# Patient Record
Sex: Female | Born: 1985
Health system: Southern US, Community
[De-identification: ages and names within clinical notes are randomized; demographics above are authoritative.]

## PROBLEM LIST (undated history)

## (undated) DIAGNOSIS — T7840XA Allergy, unspecified, initial encounter: Secondary | ICD-10-CM

## (undated) DIAGNOSIS — N809 Endometriosis, unspecified: Secondary | ICD-10-CM

## (undated) DIAGNOSIS — N189 Chronic kidney disease, unspecified: Secondary | ICD-10-CM

## (undated) DIAGNOSIS — I10 Essential (primary) hypertension: Secondary | ICD-10-CM

## (undated) DIAGNOSIS — F419 Anxiety disorder, unspecified: Secondary | ICD-10-CM

## (undated) HISTORY — PX: BREAST EXCISIONAL BIOPSY: SUR124

## (undated) HISTORY — DX: Anxiety disorder, unspecified: F41.9

## (undated) HISTORY — DX: Essential (primary) hypertension: I10

## (undated) HISTORY — PX: BREAST SURGERY: SHX581

## (undated) HISTORY — DX: Allergy, unspecified, initial encounter: T78.40XA

## (undated) HISTORY — PX: TUBAL LIGATION: SHX77

## (undated) HISTORY — PX: OTHER SURGICAL HISTORY: SHX169

## (undated) HISTORY — DX: Chronic kidney disease, unspecified: N18.9

---

## 2017-10-23 ENCOUNTER — Encounter (HOSPITAL_COMMUNITY): Payer: Self-pay | Admitting: Emergency Medicine

## 2017-10-23 DIAGNOSIS — W01190A Fall on same level from slipping, tripping and stumbling with subsequent striking against furniture, initial encounter: Secondary | ICD-10-CM | POA: Insufficient documentation

## 2017-10-23 DIAGNOSIS — Y9389 Activity, other specified: Secondary | ICD-10-CM | POA: Insufficient documentation

## 2017-10-23 DIAGNOSIS — Y929 Unspecified place or not applicable: Secondary | ICD-10-CM | POA: Insufficient documentation

## 2017-10-23 DIAGNOSIS — Z5329 Procedure and treatment not carried out because of patient's decision for other reasons: Secondary | ICD-10-CM | POA: Insufficient documentation

## 2017-10-23 DIAGNOSIS — Y999 Unspecified external cause status: Secondary | ICD-10-CM | POA: Insufficient documentation

## 2017-10-23 DIAGNOSIS — S300XXA Contusion of lower back and pelvis, initial encounter: Secondary | ICD-10-CM | POA: Insufficient documentation

## 2017-10-23 NOTE — ED Triage Notes (Signed)
Pt states she slipped on one of her daughter's toys yesterday and landed her butt bone on the entertainment center.  Pt ambulatory but states pain increases when sitting. Has been taking extra strength tylenol since yesterday without relief.

## 2017-10-24 ENCOUNTER — Emergency Department (HOSPITAL_COMMUNITY): Payer: Self-pay

## 2017-10-24 ENCOUNTER — Emergency Department (HOSPITAL_COMMUNITY)
Admission: EM | Admit: 2017-10-24 | Discharge: 2017-10-24 | Discharge status: Against Medical Advice | Payer: Self-pay | Attending: Emergency Medicine | Admitting: Emergency Medicine

## 2017-10-24 DIAGNOSIS — S300XXA Contusion of lower back and pelvis, initial encounter: Secondary | ICD-10-CM

## 2017-10-24 DIAGNOSIS — W19XXXA Unspecified fall, initial encounter: Secondary | ICD-10-CM

## 2017-10-24 HISTORY — DX: Endometriosis, unspecified: N80.9

## 2017-10-24 LAB — POC URINE PREG, ED: Preg Test, Ur: NEGATIVE

## 2017-10-24 MED ORDER — HYDROCODONE-ACETAMINOPHEN 5-325 MG PO TABS
2.0000 | ORAL_TABLET | Freq: Once | ORAL | Status: AC
  Administered 2017-10-24: 2 via ORAL
  Filled 2017-10-24: qty 2

## 2017-10-24 MED ORDER — IBUPROFEN 800 MG PO TABS
800.0000 mg | ORAL_TABLET | Freq: Once | ORAL | Status: AC
  Administered 2017-10-24: 800 mg via ORAL
  Filled 2017-10-24: qty 1

## 2017-10-24 NOTE — ED Notes (Signed)
ED Provider at bedside. 

## 2017-10-24 NOTE — ED Notes (Signed)
Pt said she had to leave and walked out

## 2017-10-24 NOTE — ED Notes (Signed)
Pt has to leave due to childcare issues

## 2017-10-24 NOTE — ED Provider Notes (Signed)
TIME SEEN: 1:41 AM  CHIEF COMPLAINT: Back pain, tailbone pain  HPI: Patient is a 31 year old female with history of endometriosis who presents to the emergency department with complaints of lower back pain and tailbone pain after she slipped on 1 of her child's toys yesterday and hit her back on a cabinet.  Denies head injury or loss of consciousness.  No numbness, tingling or focal weakness.  Has tried lidocaine patch, heating pad, ice, extra strength Tylenol without any relief.  No bowel or bladder incontinence.  No other injury.  ROS: See HPI Constitutional: no fever  Eyes: no drainage  ENT: no runny nose   Cardiovascular:  no chest pain  Resp: no SOB  GI: no vomiting GU: no dysuria Integumentary: no rash  Allergy: no hives  Musculoskeletal: no leg swelling  Neurological: no slurred speech ROS otherwise negative  PAST MEDICAL HISTORY/PAST SURGICAL HISTORY:  Past Medical History:  Diagnosis Date  . Endometriosis     MEDICATIONS:  Prior to Admission medications   Not on File    ALLERGIES:  No Known Allergies  SOCIAL HISTORY:  Social History  Substance Use Topics  . Smoking status: Never Smoker  . Smokeless tobacco: Never Used  . Alcohol use Yes     Comment: socially    FAMILY HISTORY: No family history on file.  EXAM: BP (!) 146/99 (BP Location: Right Arm)   Pulse 81   Temp 98.1 F (36.7 C) (Oral)   Resp 16   Ht 5\' 2"  (1.575 m)   Wt 49.9 kg (110 lb)   LMP 09/23/2017 (Approximate)   SpO2 100%   BMI 20.12 kg/m  CONSTITUTIONAL: Alert and oriented and responds appropriately to questions. Well-appearing; well-nourished; GCS 15 HEAD: Normocephalic; atraumatic EYES: Conjunctivae clear, PERRL, EOMI ENT: normal nose; no rhinorrhea; moist mucous membranes; pharynx without lesions noted; no dental injury; no septal hematoma NECK: Supple, no meningismus, no LAD; no midline spinal tenderness, step-off or deformity; trachea midline CARD: RRR; S1 and S2 appreciated;  no murmurs, no clicks, no rubs, no gallops RESP: Normal chest excursion without splinting or tachypnea; breath sounds clear and equal bilaterally; no wheezes, no rhonchi, no rales; no hypoxia or respiratory distress CHEST:  chest wall stable, no crepitus or ecchymosis or deformity, nontender to palpation; no flail chest ABD/GI: Normal bowel sounds; non-distended; soft, non-tender, no rebound, no guarding; no ecchymosis or other lesions noted PELVIS:  stable, nontender to palpation BACK:  The back appears normal and is tender over the lower lumbar spine without step-off or deformity, no ecchymosis or other lesions appreciated, tender over the sacrum as well EXT: Normal ROM in all joints; non-tender to palpation; no edema; normal capillary refill; no cyanosis, no bony tenderness or bony deformity of patient's extremities, no joint effusion, compartments are soft, extremities are warm and well-perfused, no ecchymosis SKIN: Normal color for age and race; warm NEURO: Moves all extremities equally, normal sensation diffusely, normal gait but does walk slowly secondary to pain PSYCH: The patient's mood and manner are appropriate. Grooming and personal hygiene are appropriate.  MEDICAL DECISION MAKING: Patient here with complaints of lower back pain and tailbone pain after an injury that occurred yesterday.  Neurologically intact.  Will obtain x-rays and pregnancy test.  She is status post BTL.  She states her friend drove her to the emergency department.  Her friend will drive her home.  We will give Vicodin, ibuprofen and reassess.  ED PROGRESS: Patient had a negative pregnancy test.  X-rays were completed  and pending.  Patient told nursing staff that she had to leave and walked out of the emergency department without getting the results of her imaging.  I was not informed that patient had left until she was already gone.    X-ray reads came back negative.    I reviewed all nursing notes, vitals,  pertinent previous records, EKGs, lab and urine results, imaging (as available).     Ward, Layla MawKristen N, DO 10/24/17 [phone]

## 2017-10-24 NOTE — ED Notes (Signed)
Pt in xray

## 2017-10-24 NOTE — ED Notes (Signed)
Bed: WA12 Expected date:  Expected time:  Means of arrival:  Comments: 

## 2018-03-19 ENCOUNTER — Emergency Department (HOSPITAL_COMMUNITY)
Admission: EM | Admit: 2018-03-19 | Discharge: 2018-03-20 | Disposition: A | Discharge status: Home / Self Care | Payer: Self-pay | Attending: Physician Assistant | Admitting: Physician Assistant

## 2018-03-19 ENCOUNTER — Encounter (HOSPITAL_COMMUNITY): Payer: Self-pay

## 2018-03-19 DIAGNOSIS — N10 Acute tubulo-interstitial nephritis: Secondary | ICD-10-CM | POA: Insufficient documentation

## 2018-03-19 LAB — BASIC METABOLIC PANEL
Anion gap: 11 (ref 5–15)
BUN: 8 mg/dL (ref 6–20)
CALCIUM: 9 mg/dL (ref 8.9–10.3)
CO2: 22 mmol/L (ref 22–32)
CREATININE: 0.91 mg/dL (ref 0.44–1.00)
Chloride: 97 mmol/L — ABNORMAL LOW (ref 101–111)
Glucose, Bld: 100 mg/dL — ABNORMAL HIGH (ref 65–99)
Potassium: 3.8 mmol/L (ref 3.5–5.1)
SODIUM: 130 mmol/L — AB (ref 135–145)

## 2018-03-19 LAB — CBC
HCT: 37.8 % (ref 36.0–46.0)
Hemoglobin: 12.6 g/dL (ref 12.0–15.0)
MCH: 27.5 pg (ref 26.0–34.0)
MCHC: 33.3 g/dL (ref 30.0–36.0)
MCV: 82.5 fL (ref 78.0–100.0)
PLATELETS: 242 10*3/uL (ref 150–400)
RBC: 4.58 MIL/uL (ref 3.87–5.11)
RDW: 12 % (ref 11.5–15.5)
WBC: 11.3 10*3/uL — AB (ref 4.0–10.5)

## 2018-03-19 LAB — I-STAT BETA HCG BLOOD, ED (MC, WL, AP ONLY): I-stat hCG, quantitative: <5 m[IU]/mL (ref <5)

## 2018-03-19 LAB — URINALYSIS, ROUTINE W REFLEX MICROSCOPIC
Bilirubin Urine: NEGATIVE
Glucose, UA: NEGATIVE mg/dL
KETONES UR: 20 mg/dL — AB
NITRITE: POSITIVE — AB
PROTEIN: 30 mg/dL — AB
Specific Gravity, Urine: 1.016 (ref 1.005–1.030)
pH: 5 (ref 5.0–8.0)

## 2018-03-19 MED ORDER — ACETAMINOPHEN 325 MG PO TABS
650.0000 mg | ORAL_TABLET | Freq: Once | ORAL | Status: AC
  Administered 2018-03-19: 650 mg via ORAL
  Filled 2018-03-19: qty 2

## 2018-03-19 MED ORDER — ONDANSETRON HCL 4 MG/2ML IJ SOLN
4.0000 mg | Freq: Once | INTRAMUSCULAR | Status: AC
  Administered 2018-03-19: 4 mg via INTRAVENOUS
  Filled 2018-03-19: qty 2

## 2018-03-19 MED ORDER — SODIUM CHLORIDE 0.9 % IV BOLUS (SEPSIS)
1000.0000 mL | Freq: Once | INTRAVENOUS | Status: AC
  Administered 2018-03-19: 1000 mL via INTRAVENOUS

## 2018-03-19 MED ORDER — MORPHINE SULFATE (PF) 4 MG/ML IV SOLN
4.0000 mg | Freq: Once | INTRAVENOUS | Status: AC
  Administered 2018-03-19: 4 mg via INTRAVENOUS
  Filled 2018-03-19: qty 1

## 2018-03-19 MED ORDER — SODIUM CHLORIDE 0.9 % IV SOLN
1.0000 g | Freq: Once | INTRAVENOUS | Status: AC
  Administered 2018-03-19: 1 g via INTRAVENOUS
  Filled 2018-03-19: qty 10

## 2018-03-19 NOTE — ED Provider Notes (Signed)
[facility]   CSN: [id number] Arrival date & time: 03/19/18  2109     History   Chief Complaint Chief Complaint  Patient presents with  . Flank Pain  . Fever  . Urinary Retention    HPI Hans EdenMelissa Holtsclaw is a 32 y.o. female.  HPI Hans EdenMelissa Shutter is a 32 y.o. female presents to emergency department complaining of left flank pain and fever.  Patient states her symptoms began 2 days ago.  She reports associated nausea.  Denies any dysuria, hematuria, frequency or urgency.  Denies any pelvic pain.  No vaginal discharge or bleeding.  She does report nausea, however no vomiting.  No diarrhea.  States pain is worsened with any movement and taking deep breaths.  She denies any chest pain or coughing.  Fever at home.  She has been taken Tylenol and Motrin around-the-clock for her fever, last dose 6 hours ago.  She states she has had multiple urinary tract infections and sometimes has no symptoms when she gets them.  She denies any history of kidney stones.  Denies any history of pyelonephritis.  Past Medical History:  Diagnosis Date  . Endometriosis     There are no active problems to display for this patient.   Past Surgical History:  Procedure Laterality Date  . BREAST SURGERY     tumor (benign) left breast removal   . CESAREAN SECTION       OB History   None      Home Medications    Prior to Admission medications   Not on File    Family History History reviewed. No pertinent family history.  Social History Social History   Tobacco Use  . Smoking status: Never Smoker  . Smokeless tobacco: Never Used  Substance Use Topics  . Alcohol use: Yes    Comment: socially  . Drug use: No     Allergies   Patient has no known allergies.   Review of Systems Review of Systems  Constitutional: Positive for chills and fever.  HENT: Negative for congestion and sore throat.   Respiratory: Negative for cough, chest  tightness and shortness of breath.   Cardiovascular: Negative for chest pain, palpitations and leg swelling.  Gastrointestinal: Positive for abdominal pain and nausea. Negative for diarrhea and vomiting.  Genitourinary: Positive for flank pain. Negative for dysuria, pelvic pain, vaginal bleeding, vaginal discharge and vaginal pain.  Musculoskeletal: Negative for arthralgias, myalgias, neck pain and neck stiffness.  Skin: Negative for rash.  Neurological: Negative for dizziness, weakness and headaches.  All other systems reviewed and are negative.    Physical Exam Updated Vital Signs BP 128/83 (BP Location: Right Arm)   Pulse (!) 106   Temp (!) 102.5 F (39.2 C) (Oral)   Resp 14   Ht 5\' 2"  (1.575 m)   Wt 49.9 kg (110 lb)   LMP 02/26/2018   SpO2 100%   BMI 20.12 kg/m   Physical Exam  Constitutional: She appears well-developed and well-nourished. No distress.  HENT:  Head: Normocephalic.  Eyes: Conjunctivae are normal.  Neck: Neck supple.  Cardiovascular: Normal rate, regular rhythm and normal heart sounds.  Pulmonary/Chest: Effort normal and breath sounds normal. No respiratory distress. She has no wheezes. She has no rales.  Abdominal: Soft. Bowel sounds are normal. She exhibits no distension. There is no tenderness. There is no rebound.  Left CVA tenderness.  Abdomen nontender.  Musculoskeletal: She exhibits no edema.  Neurological: She is alert.  Skin: Skin is warm and dry.  Psychiatric: She has a normal mood and affect. Her behavior is normal.  Nursing note and vitals reviewed.    ED Treatments / Results  Labs (all labs ordered are listed, but only abnormal results are displayed) Labs Reviewed  BASIC METABOLIC PANEL - Abnormal; Notable for the following components:      Result Value   Sodium 130 (*)    Chloride 97 (*)    Glucose, Bld 100 (*)    All other components within normal limits  CBC - Abnormal; Notable for the following components:   WBC 11.3 (*)     All other components within normal limits  URINE CULTURE  URINALYSIS, ROUTINE W REFLEX MICROSCOPIC  I-STAT BETA HCG BLOOD, ED ([facility], [facility], [facility] ONLY)    EKG None  Radiology No results found.  Procedures Procedures (including critical care time)  Medications Ordered in ED Medications  acetaminophen (TYLENOL) tablet 650 mg (has no administration in time range)  sodium chloride 0.9 % bolus 1,000 mL (has no administration in time range)  ondansetron (ZOFRAN) injection 4 mg (has no administration in time range)  morphine 4 MG/ML injection 4 mg (has no administration in time range)     Initial Impression / Assessment and Plan / ED Course  I have reviewed the triage vital signs and the nursing notes.  Pertinent labs & imaging results that were available during my care of the patient were reviewed by me and considered in my medical decision making (see chart for details).     Patient in emergency department with left flank pain, patient felt very warm during my exam, rechecked her temperature, it is 102.5.  Order Tylenol.  Otherwise vital signs unremarkable other than mild tachycardia.  She has no abdominal tenderness on exam, no pelvic pain, no vaginal discharge or bleeding.  She has history of urinary tract infections.  She has significant left CVA tenderness on exam.  I suspect she may have pyelonephritis.  Blood work obtained upfront showed elevated Kon blood cell count of 11.3.  Negative pregnancy test.  Sodium of 130.  Will give IV fluids, pain medications, antiemetics, urine analysis pending.  1:12 AM Urine showing too numerous to count Nary blood cells, positive nitrite and large leukocyte.  Consistent with infection.  In setting of left CVA tenderness, most likely pyelonephritis.  Patient received 2 L of saline, received Rocephin for the infection.  She states she feels much better.  Although considered ischemic kidney stone, her history and exam is not consistent with stone disease.   Onset of symptoms was gradual, worse with any movement, no hematuria.  No history of stones.  Her blood pressure has remained normal, do not think she is septic.  She would like to be discharged home.  Will discharge home with antibiotics, tramadol for pain, continue Tylenol Motrin for fever, drink plenty of fluids.  Instructed to follow-up closely with family doctor.  Return precautions discussed.   Vitals:   03/20/18 0000 03/20/18 0015 03/20/18 0030 03/20/18 0100  BP: 116/79  113/73 107/74  Pulse: (!) 106 (!) 108 (!) 103 (!) 105  Resp:   16 16  Temp:      TempSrc:      SpO2: 100% 100% 100% 100%  Weight:      Height:        Final Clinical Impressions(s) / ED Diagnoses   Final diagnoses:  Acute pyelonephritis    ED Discharge Orders    None  Jaynie Crumble, PA-C 03/20/18 0306    Abelino Derrick, MD 03/20/18 [phone]

## 2018-03-19 NOTE — ED Notes (Signed)
Pt presents with left sided flank pain and difficulty urinating.

## 2018-03-19 NOTE — ED Triage Notes (Signed)
Onset yesterday left flank pain, urinary frequency and urgency.  No dysuria or blood noted in urine.  Pt reports 102.2 temperature today. Pt has been taking Tylenol and Motrin, last dose Tylenol @ 2pm.

## 2018-03-20 MED ORDER — CEPHALEXIN 500 MG PO CAPS: 500.0000 mg | ORAL_CAPSULE | Freq: Three times a day (TID) | ORAL | 0 refills | Status: DC

## 2018-03-20 MED ORDER — TRAMADOL HCL 50 MG PO TABS: 50.0000 mg | ORAL_TABLET | Freq: Four times a day (QID) | ORAL | 0 refills | Status: DC | PRN

## 2018-03-20 NOTE — Discharge Instructions (Addendum)
Take Tylenol and/or Motrin for fever and pain.  Tramadol for severe pain.  Take Keflex as prescribed until all gone for your kidney infection.  If you are not improving in 2 days, return to emergency department.  Return if unable to keep your medications down due to vomiting.  Return if any new concerning symptoms.

## 2018-03-22 LAB — URINE CULTURE

## 2018-03-23 ENCOUNTER — Telehealth: Payer: Self-pay | Admitting: *Deleted

## 2018-03-23 NOTE — Telephone Encounter (Signed)
Post ED Visit - Positive Culture Follow-up  Culture report reviewed by antimicrobial stewardship pharmacist:  [] Nathan Batchelder, Pharm.D. [] Jeremy Frens, Pharm.D., BCPS AQ-ID [] Mike Maccia, Pharm.D., BCPS [x] Elizabeth Martin, Pharm.D., BCPS [] Minh Pham, Pharm.D., BCPS, AAHIVP [] Michelle Turner, Pharm.D., BCPS, AAHIVP [] Rachel Rumbarger, PharmD, BCPS [] Shannon Parkey, PharmD [] Andy Johnston, PharmD, BCPS  Positive urine culture Treated with Cephalexin, organism sensitive to the same and no further patient follow-up is required at this time.  Rachael Sherman 03/23/2018, 11:27 AM   

## 2018-06-29 ENCOUNTER — Ambulatory Visit (HOSPITAL_COMMUNITY)
Admission: EM | Admit: 2018-06-29 | Discharge: 2018-06-29 | Disposition: A | Discharge status: Home / Self Care | Payer: Self-pay | Attending: Family Medicine | Admitting: Family Medicine

## 2018-06-29 ENCOUNTER — Encounter (HOSPITAL_COMMUNITY): Payer: Self-pay | Admitting: Physician Assistant

## 2018-06-29 DIAGNOSIS — R21 Rash and other nonspecific skin eruption: Secondary | ICD-10-CM

## 2018-06-29 MED ORDER — PREDNISONE 20 MG PO TABS: 40.0000 mg | ORAL_TABLET | Freq: Every day | ORAL | 0 refills | Status: AC

## 2018-06-29 NOTE — ED Triage Notes (Signed)
Assessment per PA 

## 2018-06-29 NOTE — Discharge Instructions (Signed)
Rash more consistent with insect bite vs contact dermatitis. You can continue hydrocortisone cream and take over the counter allergy medicine such as zyrtec, allegra. If symptoms not improving, you can start prednisone as directed. Monitor for spreading redness, increased warmth, increased pain, fever, follow up for reevaluation needed.

## 2018-06-29 NOTE — ED Provider Notes (Signed)
[facility]    CSN: [id number] Arrival date & time: 06/29/18  1654     History   Chief Complaint Chief Complaint  Patient presents with  . Rash    HPI Hans EdenMelissa Zani is a 32 y.o. female.   32 year old female comes in for 2-day history of rash to the right upper arm and back.  Unsure if insect bite.  Area is itching.  Denies pain.  Denies spreading erythema, increased warmth, fever.  Denies any new hygiene product contact.  Applied hydrocortisone cream with good relief temporarily.  She has still been scratching throughout the day.     Past Medical History:  Diagnosis Date  . Endometriosis     There are no active problems to display for this patient.   Past Surgical History:  Procedure Laterality Date  . BREAST SURGERY     tumor (benign) left breast removal   . CESAREAN SECTION      OB History   None      Home Medications    Prior to Admission medications   Medication Sig Start Date End Date Taking? Authorizing Provider  predniSONE (DELTASONE) 20 MG tablet Take 2 tablets (40 mg total) by mouth daily for 5 days. 06/29/18 07/04/18  Belinda FisherYu, Sinthia Karabin V, PA-C    Family History No family history on file.  Social History Social History   Tobacco Use  . Smoking status: Never Smoker  . Smokeless tobacco: Never Used  Substance Use Topics  . Alcohol use: Yes    Comment: socially  . Drug use: No     Allergies   Patient has no known allergies.   Review of Systems Review of Systems  Reason unable to perform ROS: See HPI as above.     Physical Exam Triage Vital Signs ED Triage Vitals  Enc Vitals Group     BP      Pulse      Resp      Temp      Temp src      SpO2      Weight      Height      Head Circumference      Peak Flow      Pain Score      Pain Loc      Pain Edu?      Excl. in GC?    No data found.  Updated Vital Signs BP 125/77   Pulse 72   Temp 98.2 F (36.8 C) (Temporal)   Resp 16   SpO2 100%   Physical Exam    Constitutional: She is oriented to person, place, and time. She appears well-developed and well-nourished. No distress.  HENT:  Head: Normocephalic and atraumatic.  Eyes: Pupils are equal, round, and reactive to light. Conjunctivae are normal.  Neurological: She is alert and oriented to person, place, and time.  Skin: Skin is warm and dry. She is not diaphoretic.  Urticarial rash to the right upper arm, right scapula.  Nontender on palpation.  No erythema, increased warmth.  No fluctuance felt.    UC Treatments / Results  Labs (all labs ordered are listed, but only abnormal results are displayed) Labs Reviewed - No data to display  EKG None  Radiology No results found.  Procedures Procedures (including critical care time)  Medications Ordered in UC Medications - No data to display  Initial Impression / Assessment and Plan / UC Course  I have reviewed the triage vital signs and the  nursing notes.  Pertinent labs & imaging results that were available during my care of the patient were reviewed by me and considered in my medical decision making (see chart for details).    Patient to continue hydrocortisone cream and start antihistamine.  Refrain from scratching.  Rx of prednisone provided, can start if symptoms not improving.  Return precautions given.  Final Clinical Impressions(s) / UC Diagnoses   Final diagnoses:  Rash    ED Prescriptions    Medication Sig Dispense Auth. Provider   predniSONE (DELTASONE) 20 MG tablet Take 2 tablets (40 mg total) by mouth daily for 5 days. 10 tablet Threasa Alpha, [place] 06/29/18 1713

## 2018-10-30 ENCOUNTER — Ambulatory Visit: Payer: Self-pay | Admitting: Obstetrics and Gynecology

## 2018-11-14 ENCOUNTER — Ambulatory Visit (INDEPENDENT_AMBULATORY_CARE_PROVIDER_SITE_OTHER): Payer: Self-pay | Admitting: Family Medicine

## 2018-11-14 ENCOUNTER — Encounter: Payer: Self-pay | Admitting: Family Medicine

## 2018-11-14 VITALS — BP 104/72 | HR 62 | Temp 98.2°F | Wt 98.6 lb

## 2018-11-14 DIAGNOSIS — R350 Frequency of micturition: Secondary | ICD-10-CM

## 2018-11-14 DIAGNOSIS — Z87440 Personal history of urinary (tract) infections: Secondary | ICD-10-CM

## 2018-11-14 LAB — POC URINALSYSI DIPSTICK (AUTOMATED)
Bilirubin, UA: NEGATIVE
Blood, UA: NEGATIVE
Glucose, UA: NEGATIVE
KETONES UA: NEGATIVE
LEUKOCYTES UA: NEGATIVE
Nitrite, UA: NEGATIVE
PH UA: 8 (ref 5.0–8.0)
PROTEIN UA: NEGATIVE
Spec Grav, UA: 1.02 (ref 1.010–1.025)
Urobilinogen, UA: 0.2 E.U./dL

## 2018-11-14 NOTE — Patient Instructions (Signed)

## 2018-11-14 NOTE — Progress Notes (Signed)
Rachael EdenMelissa Proehl is a 32 y.o. female who presents today with concerns of urinary urgency and frequency. She reports a history of UTI's but has not experienced one in the last 9 months. She denies any risk for STI/STD or using any new feminine hygiene products.  Review of Systems  Constitutional: Negative for chills, fever and malaise/fatigue.  HENT: Negative for congestion, ear discharge, ear pain, sinus pain and sore throat.   Eyes: Negative.   Respiratory: Negative for cough, sputum production and shortness of breath.   Cardiovascular: Negative.  Negative for chest pain.  Gastrointestinal: Negative for abdominal pain, diarrhea, nausea and vomiting.  Genitourinary: Positive for frequency and urgency. Negative for dysuria and hematuria.  Musculoskeletal: Negative for myalgias.  Skin: Negative.   Neurological: Negative for headaches.  Endo/Heme/Allergies: Negative.   Psychiatric/Behavioral: Negative.     O: Vitals:   11/14/18 1918  BP: 104/72  Pulse: 62  Temp: 98.2 F (36.8 C)  SpO2: 100%     Physical Exam  Constitutional: She is oriented to person, place, and time. Vital signs are normal. She appears well-developed and well-nourished. She is active.  Non-toxic appearance. She does not have a sickly appearance.  HENT:  Head: Normocephalic.  Right Ear: Hearing, tympanic membrane, external ear and ear canal normal.  Left Ear: Hearing, tympanic membrane, external ear and ear canal normal.  Nose: Nose normal.  Mouth/Throat: Uvula is midline and oropharynx is clear and moist.  Neck: Normal range of motion. Neck supple.  Cardiovascular: Normal rate, regular rhythm, normal heart sounds and normal pulses.  Pulmonary/Chest: Effort normal and breath sounds normal.  Abdominal: Soft. Bowel sounds are normal. There is no tenderness. There is no CVA tenderness.  Musculoskeletal: Normal range of motion.  Lymphadenopathy:       Head (right side): No submental and no submandibular adenopathy  present.       Head (left side): No submental and no submandibular adenopathy present.    She has no cervical adenopathy.  Neurological: She is alert and oriented to person, place, and time.  Psychiatric: She has a normal mood and affect.  Vitals reviewed.   A: 1. Urinary frequency   2. History of UTI    P: Exam findings, diagnosis etiology and medication use and indications reviewed with patient. Follow- Up and discharge instructions provided. No emergent/urgent issues found on exam.  Patient verbalized understanding of information provided and agrees with plan of care (POC), all questions answered.  discussed negative results and benefit of monitoring condition and increasing hydration.  1. Urinary frequency - POCT Urinalysis Dipstick (Automated) Results for orders placed or performed in visit on 11/14/18 (from the past 24 hour(s))  POCT Urinalysis Dipstick (Automated)     Status: None   Collection Time: 11/14/18  7:34 PM  Result Value Ref Range   Color, UA yellow    Clarity, UA clear    Glucose, UA Negative Negative   Bilirubin, UA neg    Ketones, UA neg    Spec Grav, UA 1.020 1.010 - 1.025   Blood, UA neg    pH, UA 8.0 5.0 - 8.0   Protein, UA Negative Negative   Urobilinogen, UA 0.2 0.2 or 1.0 E.U./dL   Nitrite, UA neg    Leukocytes, UA Negative Negative   2. History of UTI Advised to monitor symptoms and follow up if symptoms become worse. Due to length of symptoms almost 2 week recommend monitoring since lab results and physical exam were WNL.

## 2018-11-30 MED FILL — metroNIDAZOLE 500 MG TABS: 500 | 7 days supply | Qty: 14 | Fill #0

## 2018-12-15 ENCOUNTER — Ambulatory Visit (HOSPITAL_COMMUNITY)
Admission: EM | Admit: 2018-12-15 | Discharge: 2018-12-15 | Disposition: A | Discharge status: Home / Self Care | Payer: No Typology Code available for payment source | Attending: Family Medicine | Admitting: Family Medicine

## 2018-12-15 ENCOUNTER — Ambulatory Visit (INDEPENDENT_AMBULATORY_CARE_PROVIDER_SITE_OTHER): Payer: No Typology Code available for payment source

## 2018-12-15 ENCOUNTER — Telehealth (HOSPITAL_COMMUNITY): Payer: Self-pay | Admitting: Emergency Medicine

## 2018-12-15 ENCOUNTER — Encounter (HOSPITAL_COMMUNITY): Payer: Self-pay

## 2018-12-15 DIAGNOSIS — R05 Cough: Secondary | ICD-10-CM | POA: Diagnosis not present

## 2018-12-15 DIAGNOSIS — J40 Bronchitis, not specified as acute or chronic: Secondary | ICD-10-CM | POA: Insufficient documentation

## 2018-12-15 DIAGNOSIS — R079 Chest pain, unspecified: Secondary | ICD-10-CM

## 2018-12-15 MED ORDER — AMOXICILLIN 875 MG PO TABS: 875.0000 mg | ORAL_TABLET | Freq: Two times a day (BID) | ORAL | 0 refills | Status: DC

## 2018-12-15 MED FILL — AMOXICILLIN 875 MG TABLET: 875 | 10 days supply | Qty: 20 | Fill #0

## 2018-12-15 NOTE — ED Provider Notes (Signed)
[facility]    CSN: [id number] Arrival date & time: 12/15/18  1540     History   Chief Complaint Chief Complaint  Patient presents with  . Chest Pain  . Back Pain  . Cough    HPI Rachael Sherman is a 32 y.o. female.   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  Associated mild nausea.  Initially had fever, now taking ibuprofen regularly to control symptoms.  Nonsmoker.  No h/o asthma.       Past Medical History:  Diagnosis Date  . Endometriosis     There are no active problems to display for this patient.   Past Surgical History:  Procedure Laterality Date  . BREAST SURGERY     tumor (benign) left breast removal   . CESAREAN SECTION      OB History   No obstetric history on file.      Home Medications    Prior to Admission medications   Medication Sig Start Date End Date Taking? Authorizing Provider  amoxicillin (AMOXIL) 875 MG tablet Take 1 tablet (875 mg total) by mouth 2 (two) times daily. 12/15/18   Elvina SidleLauenstein, Lusine Corlett, MD    Family History History reviewed. No pertinent family history.  Social History Social History   Tobacco Use  . Smoking status: Never Smoker  . Smokeless tobacco: Never Used  Substance Use Topics  . Alcohol use: Yes    Comment: socially  . Drug use: No     Allergies   Patient has no known allergies.   Review of Systems Review of Systems  Constitutional: Positive for fever.  HENT: Negative.   Respiratory: Positive for cough and chest tightness.   Cardiovascular: Negative.   Gastrointestinal: Positive for nausea. Negative for vomiting.     Physical Exam Triage Vital Signs ED Triage Vitals [12/15/18 1555]  Enc Vitals Group     BP (!) 121/91     Pulse Rate 66     Resp 16     Temp 98.3 F (36.8 C)     Temp Source Oral     SpO2 100 %     Weight    Height      Head Circumference      Peak Flow      Pain Score      Pain Loc      Pain Edu?      Excl. in GC?    No data found.  Updated Vital Signs BP (!) 121/91 (BP Location: Right Arm)   Pulse 66   Temp 98.3 F (36.8 C) (Oral)   Resp 16   LMP 12/02/2018 (Exact Date)   SpO2 100%    Physical Exam Vitals signs and nursing note reviewed.  Constitutional:      General: She is not in acute distress.    Appearance: She is well-developed and normal weight. She is not ill-appearing, toxic-appearing or diaphoretic.  HENT:     Head: Normocephalic.  Eyes:     Pupils: Pupils are equal, round, and reactive to light.  Neck:     Musculoskeletal: Normal range of motion and neck supple.  Cardiovascular:     Rate and Rhythm: Normal rate.     Heart sounds: Normal heart sounds.  Pulmonary:     Effort: Pulmonary effort is normal.  Breath sounds: Normal breath sounds.  Skin:    General: Skin is warm and dry.  Neurological:     General: No focal deficit present.     Mental Status: She is alert.  Psychiatric:        Mood and Affect: Mood normal.        Behavior: Behavior normal.      UC Treatments / Results  Labs (all labs ordered are listed, but only abnormal results are displayed) Labs Reviewed - No data to display  EKG None  Radiology Dg Chest 2 View  Result Date: 12/15/2018 CLINICAL DATA:  Cough and chest pain with fevers EXAM: CHEST - 2 VIEW COMPARISON:  None. FINDINGS: The heart size and mediastinal contours are within normal limits. Both lungs are clear. The visualized skeletal structures are unremarkable. Bilateral nipple shadows are noted. IMPRESSION: No active cardiopulmonary disease. Electronically Signed   By: Alcide CleverMark  Lukens M.D.   On: 12/15/2018 16:20    Procedures Procedures (including critical care time)  Medications Ordered in UC Medications - No data to display  Initial Impression / Assessment and Plan / UC Course  I have reviewed the triage vital  signs and the nursing notes.  Pertinent labs & imaging results that were available during my care of the patient were reviewed by me and considered in my medical decision making (see chart for details).    Final Clinical Impressions(s) / UC Diagnoses   Final diagnoses:  Bronchitis     Discharge Instructions     I believe the hazy density in the right middle lung is caused by a mucous plug that obstructs a bronchial tube.    ED Prescriptions    Medication Sig Dispense Auth. Provider   amoxicillin (AMOXIL) 875 MG tablet Take 1 tablet (875 mg total) by mouth 2 (two) times daily. 20 tablet Elvina SidleLauenstein, Treydon Henricks, MD     Controlled Substance Prescriptions [place] Controlled Substance Registry consulted? Not Applicable   Elvina SidleLauenstein, Giara Mcgaughey, MD 12/15/18 1630

## 2018-12-15 NOTE — Discharge Instructions (Addendum)
I believe the hazy density in the right middle lung is caused by a mucous plug that obstructs a bronchial tube.

## 2018-12-15 NOTE — ED Triage Notes (Signed)
Pt complains of chest and back pain with coughing that started 3 days ago.

## 2018-12-21 ENCOUNTER — Telehealth (HOSPITAL_COMMUNITY): Payer: Self-pay | Admitting: Family Medicine

## 2018-12-21 MED ORDER — HYDROCODONE-HOMATROPINE 5-1.5 MG/5ML PO SYRP: 5.0000 mL | ORAL_SOLUTION | Freq: Four times a day (QID) | ORAL | 0 refills | Status: DC | PRN

## 2018-12-21 NOTE — Telephone Encounter (Signed)
Cough is relentless and keeping patient awake.  Also having chest wall soreness from the cough No fever Took augmentin

## 2019-01-23 ENCOUNTER — Telehealth: Payer: No Typology Code available for payment source | Admitting: Physician Assistant

## 2019-01-23 DIAGNOSIS — N898 Other specified noninflammatory disorders of vagina: Secondary | ICD-10-CM | POA: Diagnosis not present

## 2019-01-23 MED ORDER — METRONIDAZOLE 500 MG PO TABS: 500.0000 mg | ORAL_TABLET | Freq: Two times a day (BID) | ORAL | 0 refills | Status: DC

## 2019-01-23 MED FILL — metroNIDAZOLE 500 MG TABS: 500 | 7 days supply | Qty: 14 | Fill #0

## 2019-01-23 NOTE — Progress Notes (Signed)

## 2019-03-06 ENCOUNTER — Telehealth: Payer: No Typology Code available for payment source | Admitting: Physician Assistant

## 2019-03-06 DIAGNOSIS — R300 Dysuria: Secondary | ICD-10-CM

## 2019-03-06 MED ORDER — NITROFURANTOIN MONOHYD MACRO 100 MG PO CAPS: 100.0000 mg | ORAL_CAPSULE | Freq: Two times a day (BID) | ORAL | 0 refills | Status: AC

## 2019-03-06 MED FILL — NITROFURANTOIN MONO-MCR 100: 100 | 5 days supply | Qty: 10 | Fill #0

## 2019-03-06 NOTE — Progress Notes (Signed)
We are sorry that you are not feeling well.  Here is how we plan to help!  Based on what you shared with me it looks like you most likely have a simple urinary tract infection.  A UTI (Urinary Tract Infection) is a bacterial infection of the bladder.  Most cases of urinary tract infections are simple to treat but a key part of your care is to encourage you to drink plenty of fluids and watch your symptoms carefully.  I have prescribed MacroBid 100 mg twice a day for 5 days.  Your symptoms should gradually improve. Call us if the burning in your urine worsens, you develop worsening fever, back pain or pelvic pain or if your symptoms do not resolve after completing the antibiotic.  Urinary tract infections can be prevented by drinking plenty of water to keep your body hydrated.  Also be sure when you wipe, wipe from front to back and don't hold it in!  If possible, empty your bladder every 4 hours.  Your e-visit answers were reviewed by a board certified advanced clinical practitioner to complete your personal care plan.  Depending on the condition, your plan could have included both over the counter or prescription medications.  If there is a problem please reply once you have received a response from your provider.  Your safety is important to [place].  If you have drug allergies check your prescription carefully.    You can use MyChart to ask questions about today's visit, request a non-urgent call back, or ask for a work or school excuse for 24 hours related to this e-Visit. If it has been greater than 24 hours you will need to follow up with your provider, or enter a new e-Visit to address those concerns.   You will get an e-mail in the next two days asking about your experience.  I hope that your e-visit has been valuable and will speed your recovery. Thank you for using e-visits.    ===View-only below this line===   ----- Message -----    From: Hans Eden    Sent: 03/06/2019  3:10 PM  EDT      To: E-Visit Mailing List Subject: E-Visit Submission: Urinary Problems  E-Visit Submission: Urinary Problems --------------------------------  Question: Which of the following are you experiencing? Answer:   Pain while passing urine            Change in urine appearance or smell  Question: When you have pain when passing urine, which of these apply? Answer:   I have a burning sensation  Question: Are you able to pass urine? Answer:   Yes, I can pass urine.  Question: How long have you had pain or difficulty passing urine? Answer:   More  than two days but less than one week  Question: Do you have a fever? Answer:   No, I do not have a fever  Question: Do you have any of the following? Answer:   I have back pain with this illness  Question: Do you have an exaggerated sensation of the need to pass urine? Answer:   Yes, the sensation is exaggerated  Question: Do you have the urge to urinate more of less frequently than normal? Answer:   More frequently  Question: What does your urine look like? Answer:   It is cloudy  Question: Do you have any of the following? Answer:   An unusual smell  Question: Do you have any of the following? Answer:   No  discharge  Question: Do you have any sores on your genitals? Answer:   No  Question: Do you have any history of kidney dysfunction or kidney problems? Answer:   No  Question: Within the past 3 months, have you had any surgery on your kidneys or bladder, or have you had a tube inserted to collect your urine? Answer:   No, I have never had either  Question: Have you had similar symptoms in the past? Answer:   Yes, I have had similar symptoms more than once before  Question: If you had similar symptoms in the past, did any of the following work? Answer:   Pills for urine infection  Question: Please list any additional comments  Answer:   I was prescribed doxycycline before and it worked well for me  Question:  Please list your medication allergies that you may have ? (If 'none' , please list as 'none') Answer:   None  Question: Are you pregnant? Answer:   I am confident that I am not pregnant  Question: Are you breastfeeding? Answer:   No  A total of 5-10 minutes was spent evaluating this patients questionnaire and formulating a plan of care.

## 2019-03-08 ENCOUNTER — Telehealth (HOSPITAL_COMMUNITY): Payer: Self-pay | Admitting: Family Medicine

## 2019-03-08 MED ORDER — IBUPROFEN 800 MG PO TABS: 800.0000 mg | ORAL_TABLET | Freq: Three times a day (TID) | ORAL | 0 refills | Status: DC

## 2019-03-08 NOTE — Telephone Encounter (Signed)
Patient has had recent bilateral breast tenderness with h/o breast bx on left side

## 2019-03-09 MED FILL — IBUPROFEN 800 MG TABS: 800 | 7 days supply | Qty: 21 | Fill #0

## 2019-04-02 ENCOUNTER — Other Ambulatory Visit (HOSPITAL_COMMUNITY): Payer: Self-pay | Admitting: Family Medicine

## 2019-04-02 DIAGNOSIS — N644 Mastodynia: Secondary | ICD-10-CM

## 2019-04-05 ENCOUNTER — Ambulatory Visit
Admission: RE | Admit: 2019-04-05 | Discharge: 2019-04-05 | Disposition: A | Discharge status: Home / Self Care | Payer: No Typology Code available for payment source | Source: Ambulatory Visit | Attending: Family Medicine | Admitting: Family Medicine

## 2019-04-05 ENCOUNTER — Other Ambulatory Visit: Payer: Self-pay

## 2019-04-05 DIAGNOSIS — N644 Mastodynia: Secondary | ICD-10-CM

## 2019-04-11 ENCOUNTER — Encounter (HOSPITAL_COMMUNITY): Payer: Self-pay | Admitting: Family Medicine

## 2019-04-11 ENCOUNTER — Other Ambulatory Visit: Payer: Self-pay

## 2019-04-11 ENCOUNTER — Ambulatory Visit (HOSPITAL_COMMUNITY)
Admission: EM | Admit: 2019-04-11 | Discharge: 2019-04-11 | Disposition: A | Discharge status: Home / Self Care | Payer: No Typology Code available for payment source | Attending: Family Medicine | Admitting: Family Medicine

## 2019-04-11 ENCOUNTER — Telehealth: Payer: No Typology Code available for payment source | Admitting: Nurse Practitioner

## 2019-04-11 DIAGNOSIS — S20212A Contusion of left front wall of thorax, initial encounter: Secondary | ICD-10-CM

## 2019-04-11 DIAGNOSIS — R0781 Pleurodynia: Secondary | ICD-10-CM

## 2019-04-11 DIAGNOSIS — S20211A Contusion of right front wall of thorax, initial encounter: Secondary | ICD-10-CM

## 2019-04-11 MED ORDER — HYDROCODONE-ACETAMINOPHEN 5-325 MG PO TABS: 1.0000 | ORAL_TABLET | Freq: Four times a day (QID) | ORAL | 0 refills | Status: DC | PRN

## 2019-04-11 NOTE — Progress Notes (Signed)
Based on what you shared with me it looks like you have a rib fracure ,that should be evaluated in a face to face office visit. Need a chest x ry.   NOTE: If you entered your credit card information for this eVisit, you will not be charged. You may see a "hold" on your card for the $30 but that hold will drop off and you will not have a charge processed.  If you are having a true medical emergency please call 911.  If you need an urgent face to face visit, [facility] has four urgent care centers for your convenience.  If you need care fast and have a high deductible or no insurance consider:   [email or web address] to reserve your spot online an avoid wait times  [facility] [address], [address] [place], [place] [address] 8 am to 8 pm Monday-Friday 10 am to 4 pm Saturday-Sunday *Across the street from [employer]  [address] [place], [address] 8 am to 5 pm Monday-Friday * In the [facility] on the [facility]   The following sites will take your  insurance:  . [facility]  [phone] Get Driving Directions Find a Provider at this Location  [address] [place], [place] [address] . 10 am to 8 pm Monday-Friday . 12 pm to 8 pm Saturday-Sunday   . [facility] at [facility]  [phone] Get Driving Directions Find a Provider at this Location  [address], [address] [place], [place] [address] . 8 am to 8 pm Monday-Friday . 9 am to 6 pm Saturday . 11 am to 6 pm Sunday   . [facility] at [facility]  [phone] Get Driving Directions  [address] [place], [place] [address] . 8 am to 8 pm Monday-Friday . 8 am to 4 pm Saturday-Sunday   Your e-visit answers were reviewed by a board certified advanced clinical practitioner to complete your personal care plan.  5 minutes spent reviewing and documenting in chart.

## 2019-04-11 NOTE — ED Triage Notes (Signed)
Seen  by provider only

## 2019-04-11 NOTE — Discharge Instructions (Addendum)
Be aware, pain medications may cause drowsiness. Please do not drive, operate heavy machinery or make important decisions while on this medication, it can cloud your judgement.  
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
## 2019-04-18 ENCOUNTER — Encounter (HOSPITAL_COMMUNITY): Payer: Self-pay

## 2019-04-18 ENCOUNTER — Ambulatory Visit (HOSPITAL_COMMUNITY): Payer: No Typology Code available for payment source

## 2019-04-18 ENCOUNTER — Ambulatory Visit (HOSPITAL_COMMUNITY)
Admission: EM | Admit: 2019-04-18 | Discharge: 2019-04-18 | Disposition: A | Discharge status: Home / Self Care | Payer: No Typology Code available for payment source | Attending: Family Medicine | Admitting: Family Medicine

## 2019-04-18 ENCOUNTER — Other Ambulatory Visit: Payer: Self-pay

## 2019-04-18 ENCOUNTER — Encounter: Payer: Self-pay | Admitting: Family Medicine

## 2019-04-18 ENCOUNTER — Ambulatory Visit (INDEPENDENT_AMBULATORY_CARE_PROVIDER_SITE_OTHER): Payer: No Typology Code available for payment source

## 2019-04-18 DIAGNOSIS — S20211D Contusion of right front wall of thorax, subsequent encounter: Secondary | ICD-10-CM

## 2019-04-18 DIAGNOSIS — S20212D Contusion of left front wall of thorax, subsequent encounter: Secondary | ICD-10-CM | POA: Diagnosis not present

## 2019-04-18 NOTE — ED Notes (Signed)
Patient verbalizes understanding of discharge instructions. Opportunity for questioning and answers were provided. Patient discharged from [facility] by MD. 
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
## 2019-04-18 NOTE — Telephone Encounter (Signed)
Please schedule new patient appointment.  

## 2019-04-23 ENCOUNTER — Encounter: Payer: Self-pay | Admitting: Family Medicine

## 2019-04-23 ENCOUNTER — Ambulatory Visit (INDEPENDENT_AMBULATORY_CARE_PROVIDER_SITE_OTHER): Payer: No Typology Code available for payment source | Admitting: Family Medicine

## 2019-04-23 ENCOUNTER — Other Ambulatory Visit: Payer: Self-pay

## 2019-04-23 VITALS — BP 120/82 | HR 76 | Temp 98.1°F | Resp 16 | Ht 63.0 in | Wt 96.4 lb

## 2019-04-23 DIAGNOSIS — Z1389 Encounter for screening for other disorder: Secondary | ICD-10-CM

## 2019-04-23 DIAGNOSIS — R0602 Shortness of breath: Secondary | ICD-10-CM

## 2019-04-23 DIAGNOSIS — Z131 Encounter for screening for diabetes mellitus: Secondary | ICD-10-CM | POA: Diagnosis not present

## 2019-04-23 DIAGNOSIS — Z862 Personal history of diseases of the blood and blood-forming organs and certain disorders involving the immune mechanism: Secondary | ICD-10-CM

## 2019-04-23 DIAGNOSIS — R079 Chest pain, unspecified: Secondary | ICD-10-CM

## 2019-04-23 LAB — POCT URINALYSIS DIP (CLINITEK)
Bilirubin, UA: NEGATIVE
Blood, UA: NEGATIVE
Glucose, UA: NEGATIVE mg/dL
Ketones, POC UA: NEGATIVE mg/dL
Leukocytes, UA: NEGATIVE
Nitrite, UA: NEGATIVE
POC PROTEIN,UA: NEGATIVE
Spec Grav, UA: 1.025
Urobilinogen, UA: 2 U/dL — AB
pH, UA: 6

## 2019-04-23 NOTE — Progress Notes (Signed)
Rachael Sherman, is a 33 y.o. female  XJO:[id number]  EBR:[id number]  DOB - 1986-09-11  CC:  Chief Complaint  Patient presents with  . Establish Care       HPI: Rachael Sherman is a 33 y.o. female is here today to establish care.   Bryn Shimko does not have a problem list on file.    Today's visit:  Patient was seen at [facility] urgent care with a compliant of rib pain after sustaining an contusion of right and left ribs. Reports rib pain has improved. Since episode occurred, she has developed chest pain 2/10, shortness of breath, and has had elevated blood pressure readings for over one week. Family history significant for hypertension. No family history of sudden cardiac death. She has no history of asthma or costochondritis. Home readings have ranged greater than 140/90.  She has not checked her blood pressure today. Overall, she reports feeling much better today compared to last week. Chest pain has almost completely resolved. When chest pain was present it would initiate mediastinal region then radiate bilaterally. While SOB and CP were occurring, she endorses experiencing palpitations. Palpitations has since resolved. Patient denies new headaches, chest pain, abdominal pain, nausea, new weakness , numbness or tingling, edema, body aches, fever, or cough.  Current medications:No current outpatient medications on file.   Pertinent family medical history:Patient is adopted- aware of family history siblings and maternal grandmother-Hypertension   No Known Allergies  Social History   Socioeconomic History  . Marital status: Single    Spouse name: Not on file  . Number of children: Not on file  . Years of education: Not on file  . Highest education level: Not on file  Occupational History  . Not on file  Social Needs  . Financial resource strain: Not on file  . Food insecurity:    Worry: Not on file    Inability: Not on file  . Transportation needs:    Medical: Not on file     Non-medical: Not on file  Tobacco Use  . Smoking status: Never Smoker  . Smokeless tobacco: Never Used  Substance and Sexual Activity  . Alcohol use: Yes    Comment: socially  . Drug use: No  . Sexual activity: Not on file  Lifestyle  . Physical activity:    Days per week: Not on file    Minutes per session: Not on file  . Stress: Not on file  Relationships  . Social connections:    Talks on phone: Not on file    Gets together: Not on file    Attends religious service: Not on file    Active member of club or organization: Not on file    Attends meetings of clubs or organizations: Not on file    Relationship status: Not on file  . Intimate partner violence:    Fear of current or ex partner: Not on file    Emotionally abused: Not on file    Physically abused: Not on file    Forced sexual activity: Not on file  Other Topics Concern  . Not on file  Social History Narrative  . Not on file   Review of Systems: Pertinent negatives listed in HPI Objective:   Vitals:   04/23/19 1145  BP: 120/82  Pulse: 76  Resp: 16  Temp: 98.1 F (36.7 C)  SpO2: 98%    BP Readings from Last 3 Encounters:  04/23/19 120/82  04/18/19 (!) 159/109  04/11/19 132/87    Filed  Weights   04/23/19 1145  Weight: 96 lb 6.4 oz (43.7 kg)      Physical Exam: Constitutional: Patient appears well-developed and well-nourished. No distress. HENT: Normocephalic, atraumatic, External right and left ear normal.  Eyes: Conjunctivae and EOM are normal. PERRLA, no scleral icterus. Neck: Normal ROM. Neck supple. No JVD. No tracheal deviation. No thyromegaly. CVS: RRR, S1/S2 +, no murmurs, no gallops, no carotid bruit.  Pulmonary: Effort and breath sounds normal, no stridor, rhonchi, wheezes, rales.  Abdominal: Soft. BS +, no distension, tenderness, rebound or guarding.  Musculoskeletal: Normal range of motion. No edema and no tenderness.  Neuro: Alert. Normal muscle tone coordination. Normal gait. BUE  and BLE strength 5/5. Skin: Skin is warm and dry. No rash noted. Not diaphoretic. No erythema. No pallor. Psychiatric: Normal mood and affect. Behavior, judgment, thought content normal.  Lab Results (prior encounters)  Lab Results  Component Value Date   WBC 11.3 (H) 03/19/2018   HGB 12.6 03/19/2018   HCT 37.8 03/19/2018   MCV 82.5 03/19/2018   PLT 242 03/19/2018   Lab Results  Component Value Date   CREATININE 0.91 03/19/2018   BUN 8 03/19/2018   NA 130 (L) 03/19/2018   K 3.8 03/19/2018   CL 97 (L) 03/19/2018   CO2 22 03/19/2018       Assessment and plan:  1. Chest pain, unspecified type, improved significantly. Suspect symptoms were likely related to possible costochondritis as patients symptoms have almost completely resolved. EKG was completely normal, without evidence of ischemia. Checking the following labs: - TSH - Comprehensive metabolic panel - Lipid panel  2. SOB (shortness of breath), resolved  - EKG 12-Lead, no evidence of ischemia or ST changes   3. Hx of iron deficiency anemia Rechecking the following: - CBC with Differential - Iron, TIBC and Ferritin Panel  4. Screening for blood or protein in urine - POCT URINALYSIS DIP (CLINITEK)  5. Screening for diabetes mellitus - Hemoglobin A1c   Return in about 6 months (around 10/23/2019) for Blood pressure follow-up .   The patient was given clear instructions to go to ER or return to medical center if symptoms don't improve, worsen or new problems develop. The patient verbalized understanding. The patient was advised  to call and obtain lab results if they haven't heard anything from out office within 7-10 business days.  Joaquin CourtsKimberly Ixel Boehning, FNP [facility] [address], [place] [id number] [phone]: [phone]    This note has been created with Dragon speech recognition software and Paediatric nursesmart phrase technology. Any transcriptional errors are unintentional.

## 2019-04-23 NOTE — ED Provider Notes (Signed)
[facility] CARE [facility]ENTER   [id number] 04/18/19 Arrival Time: 1137  ASSESSMENT & PLAN:  1. Contusion of ribs, left, subsequent encounter   2. Contusion of ribs, right, subsequent encounter    I have personally viewed the imaging studies ordered this visit. No pneumothorax or rib fractures seen.   Continue current medications. May f/u as needed.  Reviewed expectations re: course of current medical issues. Questions answered. Outlined signs and symptoms indicating need for more acute intervention. Patient verbalized understanding. After Visit Summary given.   SUBJECTIVE: History from: patient. Rachael Sherman is a 33 y.o. female whom I saw on 04/11/2019 for rib contusions. Pain has not worsened. Using hydrocodone sparingly. She returns for intermittent SOB. Transient. Is associated with pain. Afebrile. No cough/n/v. Ambulatory without difficulty. Has been able to work.  ROS: As per HPI. All other systems negative.    OBJECTIVE:  Vitals:   04/18/19 1205  BP: (!) 159/109  Pulse: 82  Resp: 17  Temp: 98.4 F (36.9 C)  TempSrc: Oral  SpO2: 100%    General appearance: alert; no distress Lungs: clear to auscultation bilaterally; unlabored Heart: regular rate and rhythm without murmer Back: FROM at waist Extremities: no edema Skin: warm and dry Neurologic: normal gait Psychological: alert and cooperative; normal mood and affect  Imaging: Dg Chest 2 View  Result Date: 04/18/2019 CLINICAL DATA:  Chest pain and shortness of breath. Recent chest injury EXAM: CHEST - 2 VIEW COMPARISON:  December 15, 2018 FINDINGS: The lungs are clear. The heart size and pulmonary vascularity are normal. No adenopathy. No pneumothorax. No bone lesions. IMPRESSION: No edema or consolidation.  No evident pneumothorax. Electronically Signed   By: Bretta Bang III M.D.   On: 04/18/2019 12:17   No Known Allergies  Past Medical History:  Diagnosis Date  . Endometriosis    Social History    Socioeconomic History  . Marital status: Single    Spouse name: Not on file  . Number of children: Not on file  . Years of education: Not on file  . Highest education level: Not on file  Occupational History  . Not on file  Social Needs  . Financial resource strain: Not on file  . Food insecurity:    Worry: Not on file    Inability: Not on file  . Transportation needs:    Medical: Not on file    Non-medical: Not on file  Tobacco Use  . Smoking status: Never Smoker  . Smokeless tobacco: Never Used  Substance and Sexual Activity  . Alcohol use: Yes    Comment: socially  . Drug use: No  . Sexual activity: Not on file  Lifestyle  . Physical activity:    Days per week: Not on file    Minutes per session: Not on file  . Stress: Not on file  Relationships  . Social connections:    Talks on phone: Not on file    Gets together: Not on file    Attends religious service: Not on file    Active member of club or organization: Not on file    Attends meetings of clubs or organizations: Not on file    Relationship status: Not on file  . Intimate partner violence:    Fear of current or ex partner: Not on file    Emotionally abused: Not on file    Physically abused: Not on file    Forced sexual activity: Not on file  Other Topics Concern  . Not on file  Social History Narrative  . Not on file    Past Surgical History:  Procedure Laterality Date  . BREAST EXCISIONAL BIOPSY Left    fibroadenoma  . BREAST SURGERY     tumor (benign) left breast removal   . CESAREAN SECTION       Mardella LaymanHagler, Jahnay Lantier, MD 04/23/19 [phone]

## 2019-04-23 NOTE — Patient Instructions (Addendum)
[facility]    Rachael Sherman was seen by Joaquin CourtsKimberly Wing Gfeller, FNP today.   Tasheka Jerry's primary care doctor is Bing NeighborsHarris, Jesseka Drinkard S, FNP.   For the best care possible,  you should try to see Joaquin CourtsKimberly Maisy Newport, FNP-C  whenever you come to clinic.   We look forward to seeing you again soon!  If you have any questions about your visit today,  please call us at [phone]  Or feel free to reach your provider via MyChart.      Continue to check your blood pressure. If reading began to increase above 130/90, please follow-up for evaluation of possible underlying hypertension.  Your EKG and recent chest x-ray were both normal.  You will be notified of your labs results via MyChart.    Heart Disease Prevention Heart disease is the leading cause of death in the world. Coronary artery disease is the most common cause of heart disease. This condition results when cholesterol and other substances (plaque) build up inside the walls of the blood vessels that supply your heart muscle (arteries). This buildup in arteries is called atherosclerosis. You can take actions to lower your risk of heart disease. How can heart disease affect me? Heart disease can cause many unpleasant symptoms and complications, such as:  Chest pain (angina).  Reduced or blocked blood flow to your heart. This can cause: ? Irregular heartbeats (arrhythmias). ? Heart attack. ? Heart failure. What can increase my risk? The following factors may make you more likely to develop this condition:  High blood pressure (hypertension).  High cholesterol.  Smoking.  A diet high in saturated fats or trans fats.  Lack of physical activity.  Obesity.  Drinking too much alcohol.  Diabetes.  Having a family history of heart disease. What actions can I take to prevent heart disease? Nutrition   Eat a heart-healthy eating plan as told by your health care  provider. Examples include the DASH (Dietary Approaches to Stop Hypertension) eating plan or the Mediterranean diet.  Generally, it is recommended that you: ? Eat less salt (sodium). Ask your health care provider how much sodium is safe for you. Most people should have less than 2,300 mg each day. ? Limit unhealthy fats, such as saturated and trans fats, in your diet. You can do this by eating low-fat dairy products, eating less red meat, and avoiding processed foods. ? Eat healthy fats (omega-3 fatty acids). These are found in fish, such as mackerel or salmon. ? Eat more fruits and vegetables. You should try to fill one-half of your plate with fruits and vegetables at each meal. ? Eat more whole grains. ? Avoid foods and drinks that have added sugars. Lifestyle   Get regular exercise. This is one of the most important things you can do for your health. Generally, it is recommended that you: ? Exercise for at least 30 minutes on most days of the week (150 minutes each week). The exercise should increase your heart rate and make you sweat (aerobic exercise). ? Add strength exercises on at least 2 days each week.  Do not use any products that contain nicotine or tobacco, such as cigarettes and e-cigarettes. These can damage your heart and blood vessels. If you need help quitting, ask your health care provider. Alcohol use  Do not drink alcohol if: ? Your health care provider tells you not to drink. ? You are pregnant, may be pregnant, or are planning  to become pregnant.  If you drink alcohol, limit how much you have: ? 0-1 drink a day for women. ? 0-2 drinks a day for men.  Be aware of how much alcohol is in your drink. In the [place] one drink equals one typical bottle of beer (12 oz), one-half glass of wine (5 oz), or one shot of hard liquor (1 oz). Medicines  Take over-the-counter and prescription medicines only as told by your health care provider.  Ask your health care provider  whether you should take an aspirin every day. Taking aspirin may help reduce your risk of heart disease and stroke.  Depending on your risk factors, your health care provider may prescribe medicines to lower your risk of heart disease or to control related conditions. You may take medicine to: ? Lower cholesterol. ? Control blood pressure. ? Control diabetes. General information  Keep your blood pressure under control, as recommended by your health care provider. For most healthy people, the upper number of your blood pressure (systolic) should be no higher than 120, and the lower number (diastolic) no higher than 80. Treatment may be needed if your blood pressure is higher than 130/80.  Have your blood pressure checked at least every two years. Your health care provider may check your blood pressure more often if you have high blood pressure.  After age 70, have your cholesterol checked every 4-6 years. If you have risk factors for heart disease, you may need to have it checked more frequently. Treatment may be needed if your cholesterol is high.  Have your body mass index (BMI) checked every year. Your health care provider can calculate your BMI from your height and weight.  Work with your health care provider to lose weight, if needed, or to maintain a healthy weight. Where to find more information:  Centers for Disease Control and Prevention: https://ball-collins.biz/  American Heart Association: www.heart.org ? Take a free online heart disease risk quiz to better understand your personal risk factors. Summary  Heart disease is the leading cause of death in the world.  Heart disease can cause chest pain, abnormal heart rhythms, heart attack, and heart failure.  High blood pressure, high cholesterol, and smoking are the main risk factors for heart disease, although other factors also contribute.  You can take actions to lower your chances of developing heart disease. Work with your  health care provider to reduce your risk by following a heart-healthy diet, being physically active, and controlling your weight, blood pressure, and cholesterol level. This information is not intended to replace advice given to you by your health care provider. Make sure you discuss any questions you have with your health care provider. Document Released: 07/27/2004 Document Revised: 12/28/2017 Document Reviewed: 12/28/2017 Elsevier Interactive Patient Education  2019 [employer].

## 2019-04-24 LAB — COMPREHENSIVE METABOLIC PANEL
ALT: 14 IU/L (ref 0–32)
AST: 14 IU/L (ref 0–40)
Albumin/Globulin Ratio: 1.2 (ref 1.2–2.2)
Albumin: 4.2 g/dL (ref 3.8–4.8)
Alkaline Phosphatase: 50 IU/L (ref 39–117)
BUN/Creatinine Ratio: 21 (ref 9–23)
BUN: 15 mg/dL (ref 6–20)
Bilirubin Total: 0.5 mg/dL (ref 0.0–1.2)
CO2: 23 mmol/L (ref 20–29)
Calcium: 9.4 mg/dL (ref 8.7–10.2)
Chloride: 99 mmol/L (ref 96–106)
Creatinine, Ser: 0.7 mg/dL (ref 0.57–1.00)
GFR calc Af Amer: 134 mL/min/{1.73_m2} (ref >59)
GFR calc non Af Amer: 116 mL/min/{1.73_m2} (ref >59)
Globulin, Total: 3.4 g/dL (ref 1.5–4.5)
Glucose: 73 mg/dL (ref 65–99)
Potassium: 4.3 mmol/L (ref 3.5–5.2)
Sodium: 138 mmol/L (ref 134–144)
Total Protein: 7.6 g/dL (ref 6.0–8.5)

## 2019-04-24 LAB — LIPID PANEL
Chol/HDL Ratio: 2.5 ratio (ref 0.0–4.4)
Cholesterol, Total: 187 mg/dL (ref 100–199)
HDL: 75 mg/dL (ref >39)
LDL Calculated: 101 mg/dL — ABNORMAL HIGH (ref 0–99)
Triglycerides: 55 mg/dL (ref 0–149)
VLDL Cholesterol Cal: 11 mg/dL (ref 5–40)

## 2019-04-24 LAB — HEMOGLOBIN A1C
Est. average glucose Bld gHb Est-mCnc: 97 mg/dL
Hgb A1c MFr Bld: 5 % (ref 4.8–5.6)

## 2019-04-24 LAB — CBC WITH DIFFERENTIAL/PLATELET
Basophils Absolute: 0 10*3/uL (ref 0.0–0.2)
Basos: 1 %
EOS (ABSOLUTE): 0.2 10*3/uL (ref 0.0–0.4)
Eos: 4 %
Hematocrit: 38.4 % (ref 34.0–46.6)
Hemoglobin: 12.6 g/dL (ref 11.1–15.9)
Immature Grans (Abs): 0 10*3/uL (ref 0.0–0.1)
Immature Granulocytes: 0 %
Lymphocytes Absolute: 1.6 10*3/uL (ref 0.7–3.1)
Lymphs: 41 %
MCH: 27 pg (ref 26.6–33.0)
MCHC: 32.8 g/dL (ref 31.5–35.7)
MCV: 82 fL (ref 79–97)
Monocytes Absolute: 0.5 10*3/uL (ref 0.1–0.9)
Monocytes: 12 %
Neutrophils Absolute: 1.6 10*3/uL (ref 1.4–7.0)
Neutrophils: 42 %
Platelets: 270 10*3/uL (ref 150–450)
RBC: 4.66 x10E6/uL (ref 3.77–5.28)
RDW: 12 % (ref 11.7–15.4)
WBC: 3.8 10*3/uL (ref 3.4–10.8)

## 2019-04-24 LAB — TSH: TSH: 1.52 u[IU]/mL (ref 0.450–4.500)

## 2019-04-24 LAB — IRON,TIBC AND FERRITIN PANEL
Ferritin: 35 ng/mL (ref 15–150)
Iron Saturation: 20 % (ref 15–55)
Iron: 64 ug/dL (ref 27–159)
Total Iron Binding Capacity: 324 ug/dL (ref 250–450)
UIBC: 260 ug/dL (ref 131–425)

## 2019-05-07 ENCOUNTER — Encounter: Payer: Self-pay | Admitting: Family Medicine

## 2019-07-06 ENCOUNTER — Encounter: Payer: Self-pay | Admitting: Family Medicine

## 2019-07-13 ENCOUNTER — Telehealth: Payer: No Typology Code available for payment source | Admitting: Family

## 2019-07-13 DIAGNOSIS — N898 Other specified noninflammatory disorders of vagina: Secondary | ICD-10-CM

## 2019-07-13 MED ORDER — FLUCONAZOLE 150 MG PO TABS: 150.0000 mg | ORAL_TABLET | Freq: Once | ORAL | 0 refills | Status: AC

## 2019-07-13 NOTE — Progress Notes (Signed)

## 2019-07-17 ENCOUNTER — Ambulatory Visit (INDEPENDENT_AMBULATORY_CARE_PROVIDER_SITE_OTHER): Payer: No Typology Code available for payment source | Admitting: Licensed Clinical Social Worker

## 2019-07-17 ENCOUNTER — Other Ambulatory Visit: Payer: Self-pay

## 2019-07-17 DIAGNOSIS — F419 Anxiety disorder, unspecified: Secondary | ICD-10-CM | POA: Diagnosis not present

## 2019-07-17 NOTE — BH Specialist Note (Signed)
Integrated Behavioral Health Initial Visit  MRN: [id number] Name: Rachael Sherman  Number of [place] Clinician visits:: 1/6 Session Start time: 2:15 PM  Session End time: 2:45 PM Total time: 30 minutes  Type of Service: [place] Interpretor:No. Interpretor Name and Language: N/A    SUBJECTIVE: Rachael Sherman is a 33 y.o. female accompanied by self Patient was referred by FNP Kenton Kingfisher for anxiety. Patient reports the following symptoms/concerns: Pt reports fatigue, difficulty sleeping, restlessness, feelings of overwhelming worry, racing thoughts, and panic attacks Duration of problem: Pt was diagnosed with anxiety and depression approx 2009-2010; Severity of problem: severe  OBJECTIVE: Mood: Anxious and Affect: Appropriate Risk of harm to self or others: No plan to harm self or others  LIFE CONTEXT: Family and Social: Pt resides with her two minor children (29 and 37 yo) She does not receive any family support; however, has friends that reside locally School/Work: Pt is employed through [employer].  Self-Care: Pt receives psychotherapy with a private therapist on a weekly basis since February 2020 Life Changes: Pt has difficulty managing mental health  GOALS ADDRESSED: Patient will: 1. Reduce symptoms of: anxiety 2. Increase knowledge and/or ability of: coping skills and self-management skills  3. Demonstrate ability to: Increase adequate support systems for patient/family  INTERVENTIONS: Interventions utilized: Solution-Focused Strategies and Mindfulness or Relaxation Training  Standardized Assessments completed: GAD-7 and PHQ 2&9  ASSESSMENT: Patient currently experiencing difficulty managing anxiety. She reports reports fatigue, difficulty sleeping, restlessness, feelings of overwhelming worry, racing thoughts, and panic attacks. Pt receives psychotherapy with a private therapist on a weekly basis since February 2020. Denies  suicidal or homicidal ideations.   Patient may benefit from medication management. She has been off medications (Xanax and Lexapro) for approximately three years due to ineffectiveness. Pt is open to medication management and has agreed to schedule an appointment with a medical provider to discuss options. If needed, we can complete referral to psychiatry.   LCSW discussed grounding interventions during panic attacks to assist with management.   PLAN: 1. Follow up with behavioral health clinician on : Pt receives psychotherapy on weekly basis.  2. Behavioral recommendations: LCSW recommends pt schedule appointment with medical provider to discuss initiating medication management 3. Referral(s): [place] (In Clinic) 4. "From scale of 1-10, how likely are you to follow plan?": 10  Rebekah Chesterfield, LCSW 07/18/2019 9:06 AM

## 2019-07-18 ENCOUNTER — Encounter: Payer: Self-pay | Admitting: Nurse Practitioner

## 2019-07-18 ENCOUNTER — Ambulatory Visit (INDEPENDENT_AMBULATORY_CARE_PROVIDER_SITE_OTHER): Payer: No Typology Code available for payment source | Admitting: Nurse Practitioner

## 2019-07-18 DIAGNOSIS — F5101 Primary insomnia: Secondary | ICD-10-CM | POA: Diagnosis not present

## 2019-07-18 DIAGNOSIS — F329 Major depressive disorder, single episode, unspecified: Secondary | ICD-10-CM

## 2019-07-18 DIAGNOSIS — F419 Anxiety disorder, unspecified: Secondary | ICD-10-CM

## 2019-07-18 MED ORDER — TRAZODONE HCL 100 MG PO TABS: 100.0000 mg | ORAL_TABLET | Freq: Every day | ORAL | 1 refills | Status: DC

## 2019-07-18 MED FILL — traZODone HCL 100 MG TABS: 100 | 30 days supply | Qty: 30 | Fill #0

## 2019-07-18 NOTE — Progress Notes (Signed)
Virtual Visit via Telephone Note Due to national recommendations of social distancing due to COVID 19, telehealth visit is felt to be most appropriate for this patient at this time.  I discussed the limitations, risks, security and privacy concerns of performing an evaluation and management service by telephone and the availability of in person appointments. I also discussed with the patient that there may be a patient responsible charge related to this service. The patient expressed understanding and agreed to proceed.    I connected with Rachael Sherman on 07/18/19  at  10:50 AM EDT  EDT by telephone and verified that I am speaking with the correct person using two identifiers.   Consent I discussed the limitations, risks, security and privacy concerns of performing an evaluation and management service by telephone and the availability of in person appointments. I also discussed with the patient that there may be a patient responsible charge related to this service. The patient expressed understanding and agreed to proceed.   Location of Patient: Private  Residence   [facility]    Persons participating in Telemedicine visit: [name]  FNP-BC YY NundaBien CMA Jacqualin Stopa    History of Present Illness: Telemedicine visit for: Depression and anxiety  Symptoms of anxiety in the past have included chest pain, fatigue, feelings of dyspnea, racing thoughts, insomnia, restlessness, headaches.  She was referred to our in-house LCSW yesterday.  Per LCSW notes patient does receive psychotherapy with the private therapy on a weekly basis.    Today patient states she is interested in pharmacological intervention as she feels her anxiety and Insomnia are worsening.  She has difficulty staying and falling asleep a has tried melatonin and zyquil pm in the past which leaves her feeling groggy the next day. She states that she has reduced her daily  intake of caffeine and does not nap during the day.  Has tried lexapro in this past which made her feel like she was having an out of body experience. She has taken low-dose Xanax as needed.  We will start her on trazodone for anxiety this may also help with her depression.  We will follow-up in a few weeks and will likely need adjunct agent for her anxiety.   Depression [facility] 07/17/2019 04/23/2019  Decreased Interest 2 0  Down, Depressed, Hopeless 0 0  PHQ - 2 Score 2 0  Altered sleeping 3 2  Tired, decreased energy 2 2  Change in appetite 2 2  Feeling bad or failure about yourself  2 0  Trouble concentrating 0 0  Moving slowly or fidgety/restless 0 0  Suicidal thoughts 0 0  PHQ-9 Score 11 6   GAD 7 : Generalized Anxiety Score 07/17/2019 04/23/2019  Nervous, Anxious, on Edge 3 0  Control/stop worrying 3 0  Worry too much - different things 3 0  Trouble relaxing 3 0  Restless 3 0  Easily annoyed or irritable 3 0  Afraid - awful might happen 2 0  Total GAD 7 Score 20 0    Past Medical History:  Diagnosis Date  . Endometriosis     Past Surgical History:  Procedure Laterality Date  . BREAST EXCISIONAL BIOPSY Left    fibroadenoma  . BREAST SURGERY     tumor (benign) left breast removal   . CESAREAN SECTION      No family history on file.  Social History   Socioeconomic History  . Marital status: Single  Spouse name: Not on file  . Number of children: Not on file  . Years of education: Not on file  . Highest education level: Not on file  Occupational History  . Not on file  Social Needs  . Financial resource strain: Not on file  . Food insecurity    Worry: Not on file    Inability: Not on file  . Transportation needs    Medical: Not on file    Non-medical: Not on file  Tobacco Use  . Smoking status: Never Smoker  . Smokeless tobacco: Never Used  Substance and Sexual Activity  . Alcohol use: Yes    Comment: socially  . Drug use: No  . Sexual activity:  Not on file  Lifestyle  . Physical activity    Days per week: Not on file    Minutes per session: Not on file  . Stress: Not on file  Relationships  . Social Herbalist on phone: Not on file    Gets together: Not on file    Attends religious service: Not on file    Active member of club or organization: Not on file    Attends meetings of clubs or organizations: Not on file    Relationship status: Not on file  Other Topics Concern  . Not on file  Social History Narrative  . Not on file     Observations/Objective: Awake, alert and oriented x 3   Review of Systems  Constitutional: Negative for fever, malaise/fatigue and weight loss.  HENT: Negative.  Negative for nosebleeds.   Eyes: Negative.  Negative for blurred vision, double vision and photophobia.  Respiratory: Negative.  Negative for cough and shortness of breath.   Cardiovascular: Negative.  Negative for chest pain, palpitations and leg swelling.  Gastrointestinal: Negative.  Negative for heartburn, nausea and vomiting.  Musculoskeletal: Negative.  Negative for myalgias.  Neurological: Negative.  Negative for dizziness, focal weakness, seizures and headaches.  Psychiatric/Behavioral: Positive for depression. Negative for hallucinations, memory loss, substance abuse and suicidal ideas. The patient is nervous/anxious and has insomnia.     Assessment and Plan: Diagnoses and all orders for this visit:  Primary insomnia -     traZODone (DESYREL) 100 MG tablet; Take 1 tablet (100 mg total) by mouth at bedtime.  Anxiety and depression -     traZODone (DESYREL) 100 MG tablet; Take 1 tablet (100 mg total) by mouth at bedtime.     Follow Up Instructions Return in about 2 weeks (around 08/01/2019) for anxiety, insomnia.     I discussed the assessment and treatment plan with the patient. The patient was provided an opportunity to ask questions and all were answered. The patient agreed with the plan and demonstrated  an understanding of the instructions.   The patient was advised to call back or seek an in-person evaluation if the symptoms worsen or if the condition fails to improve as anticipated.  I provided 24 minutes of non-face-to-face time during this encounter including median intraservice time, reviewing previous notes, labs, imaging, medications and explaining diagnosis and management.  Gildardo Pounds, FNP-BC

## 2019-07-20 ENCOUNTER — Telehealth: Payer: No Typology Code available for payment source | Admitting: Family

## 2019-07-20 DIAGNOSIS — B373 Candidiasis of vulva and vagina: Secondary | ICD-10-CM

## 2019-07-20 DIAGNOSIS — B3731 Acute candidiasis of vulva and vagina: Secondary | ICD-10-CM

## 2019-07-20 MED ORDER — FLUCONAZOLE 150 MG PO TABS: 150.0000 mg | ORAL_TABLET | Freq: Once | ORAL | 0 refills | Status: AC

## 2019-07-20 NOTE — Progress Notes (Signed)
Greater than 5 minutes, yet less than 10 minutes of time have been spent researching, coordinating, and implementing care for this patient today.  Thank you for the details you included in the comment boxes. Those details are very helpful in determining the best course of treatment for you and help us to provide the best care.  We are sorry that you are not feeling well. Here is how we plan to help! Based on what you shared with me it looks like you: May have a yeast vaginosis  Vaginosis is an inflammation of the vagina that can result in discharge, itching and pain. The cause is usually a change in the normal balance of vaginal bacteria or an infection. Vaginosis can also result from reduced estrogen levels after menopause.  The most common causes of vaginosis are:   Bacterial vaginosis which results from an overgrowth of one on several organisms that are normally present in your vagina.   Yeast infections which are caused by a naturally occurring fungus called candida.   Vaginal atrophy (atrophic vaginosis) which results from the thinning of the vagina from reduced estrogen levels after menopause.   Trichomoniasis which is caused by a parasite and is commonly transmitted by sexual intercourse.  Factors that increase your risk of developing vaginosis include: . Medications, such as antibiotics and steroids . Uncontrolled diabetes . Use of hygiene products such as bubble bath, vaginal spray or vaginal deodorant . Douching . Wearing damp or tight-fitting clothing . Using an intrauterine device (IUD) for birth control . Hormonal changes, such as those associated with pregnancy, birth control pills or menopause . Sexual activity . Having a sexually transmitted infection  Your treatment plan is A single Diflucan (fluconazole) 150mg tablet once.  I have electronically sent this prescription into the pharmacy that you have chosen.  Be sure to take all of the medication as directed. Stop  taking any medication if you develop a rash, tongue swelling or shortness of breath. Mothers who are breast feeding should consider pumping and discarding their breast milk while on these antibiotics. However, there is no consensus that infant exposure at these doses would be harmful.  Remember that medication creams can weaken latex condoms. .   HOME CARE:  Good hygiene may prevent some types of vaginosis from recurring and may relieve some symptoms:  . Avoid baths, hot tubs and whirlpool spas. Rinse soap from your outer genital area after a shower, and dry the area well to prevent irritation. Don't use scented or harsh soaps, such as those with deodorant or antibacterial action. . Avoid irritants. These include scented tampons and pads. . Wipe from front to back after using the toilet. Doing so avoids spreading fecal bacteria to your vagina.  Other things that may help prevent vaginosis include:  . Don't douche. Your vagina doesn't require cleansing other than normal bathing. Repetitive douching disrupts the normal organisms that reside in the vagina and can actually increase your risk of vaginal infection. Douching won't clear up a vaginal infection. . Use a latex condom. Both female and female latex condoms may help you avoid infections spread by sexual contact. . Wear cotton underwear. Also wear pantyhose with a cotton crotch. If you feel comfortable without it, skip wearing underwear to bed. Yeast thrives in moist environments Your symptoms should improve in the next day or two.  GET HELP RIGHT AWAY IF:  . You have pain in your lower abdomen ( pelvic area or over your ovaries) . You develop nausea   or vomiting . You develop a fever . Your discharge changes or worsens . You have persistent pain with intercourse . You develop shortness of breath, a rapid pulse, or you faint.  These symptoms could be signs of problems or infections that need to be evaluated by a medical provider  now.  MAKE SURE YOU    Understand these instructions.  Will watch your condition.  Will get help right away if you are not doing well or get worse.  Your e-visit answers were reviewed by a board certified advanced clinical practitioner to complete your personal care plan. Depending upon the condition, your plan could have included both over the counter or prescription medications. Please review your pharmacy choice to make sure that you have choses a pharmacy that is open for you to pick up any needed prescription, Your safety is important to us. If you have drug allergies check your prescription carefully.   You can use MyChart to ask questions about today's visit, request a non-urgent call back, or ask for a work or school excuse for 24 hours related to this e-Visit. If it has been greater than 24 hours you will need to follow up with your provider, or enter a new e-Visit to address those concerns. You will get a MyChart message within the next two days asking about your experience. I hope that your e-visit has been valuable and will speed your recovery.  

## 2019-07-23 MED FILL — FLUCONAZOLE 150 MG TABS: 150 | 1 days supply | Qty: 1 | Fill #0

## 2019-07-27 ENCOUNTER — Telehealth: Payer: Self-pay

## 2019-07-27 NOTE — Telephone Encounter (Signed)
Called patient to do their pre-visit COVID screening.  Call went to voicemail. Unable to do prescreening.  

## 2019-07-30 ENCOUNTER — Ambulatory Visit: Payer: No Typology Code available for payment source | Admitting: Family Medicine

## 2019-08-01 ENCOUNTER — Ambulatory Visit: Payer: No Typology Code available for payment source | Admitting: Nurse Practitioner

## 2019-08-07 ENCOUNTER — Telehealth: Payer: Self-pay

## 2019-08-07 NOTE — Telephone Encounter (Signed)
Called patient to do their pre-visit COVID screening.  Call went to voicemail. Unable to prescreen.  

## 2019-08-08 ENCOUNTER — Other Ambulatory Visit: Payer: Self-pay

## 2019-08-08 ENCOUNTER — Ambulatory Visit (INDEPENDENT_AMBULATORY_CARE_PROVIDER_SITE_OTHER): Payer: No Typology Code available for payment source | Admitting: Nurse Practitioner

## 2019-08-08 ENCOUNTER — Encounter: Payer: Self-pay | Admitting: Nurse Practitioner

## 2019-08-08 VITALS — BP 117/78 | HR 70 | Temp 97.5°F | Resp 17 | Wt 101.0 lb

## 2019-08-08 DIAGNOSIS — G4700 Insomnia, unspecified: Secondary | ICD-10-CM

## 2019-08-08 DIAGNOSIS — F419 Anxiety disorder, unspecified: Secondary | ICD-10-CM

## 2019-08-08 MED ORDER — HYDROXYZINE HCL 25 MG PO TABS: 25.0000 mg | ORAL_TABLET | Freq: Three times a day (TID) | ORAL | 1 refills | Status: DC | PRN

## 2019-08-08 MED FILL — hydrOXYzine HCL 25 MG TABS: 25 | 20 days supply | Qty: 60 | Fill #0

## 2019-08-08 NOTE — Progress Notes (Signed)
Has noticed improvement with her sleep. Is able to fall asleep quickly & stay asleep. Wakes up feeling rested.  Not much improved with anxiety.

## 2019-08-08 NOTE — Patient Instructions (Signed)

## 2019-08-08 NOTE — Progress Notes (Signed)
Assessment & Plan:  Efraim KaufmannMelissa was seen today for anxiety and insomnia.  Diagnoses and all orders for this visit:  Anxiety -     hydrOXYzine (ATARAX/VISTARIL) 25 MG tablet; Take 1 tablet (25 mg total) by mouth 3 (three) times daily as needed.  Insomnia, unspecified type Doing well with trazodone 100 mg QHS.    Patient has been counseled on age-appropriate routine health concerns for screening and prevention. These are reviewed and up-to-date. Referrals have been placed accordingly. Immunizations are up-to-date or declined.    Subjective:   Chief Complaint  Patient presents with  . Anxiety  . Insomnia   HPI Rachael Sherman 33 y.o. female presents to office today for a follow-up to anxiety and insomnia.  At her last telemetry visit with me a few weeks ago I started her on trazodone 100 mg for insomnia which today she reports has provided significant relief of her symptoms.  In regard to anxiety she reports taking Xanax many years ago however recently has not been on any anxiolytic.  She is not interested in starting an SSRI so therefore will initiate hydroxyzine 25 mg 3 times daily as needed today.  She denies any current thoughts of self-harm and does not feel depression is an issue for her. [facility] 08/08/2019 07/17/2019 04/23/2019  Decreased Interest 0 2 0  Down, Depressed, Hopeless 0 0 0  PHQ - 2 Score 0 2 0  Altered sleeping 0 3 2  Tired, decreased energy 0 2 2  Change in appetite 1 2 2   Feeling bad or failure about yourself  0 2 0  Trouble concentrating 0 0 0  Moving slowly or fidgety/restless 0 0 0  Suicidal thoughts 0 0 0  PHQ-9 Score 1 11 6    GAD 7 : Generalized Anxiety Score 08/08/2019 07/17/2019 04/23/2019  Nervous, Anxious, on Edge 2 3 0  Control/stop worrying 2 3 0  Worry too much - different things 2 3 0  Trouble relaxing 1 3 0  Restless 1 3 0  Easily annoyed or irritable 1 3 0  Afraid - awful might happen 1 2 0  Total GAD 7 Score 10 20 0    Review  of Systems  Constitutional: Negative for fever, malaise/fatigue and weight loss.  HENT: Negative.  Negative for nosebleeds.   Eyes: Negative.  Negative for blurred vision, double vision and photophobia.  Respiratory: Negative.  Negative for cough and shortness of breath.   Cardiovascular: Negative.  Negative for chest pain, palpitations and leg swelling.  Gastrointestinal: Negative.  Negative for heartburn, nausea and vomiting.  Musculoskeletal: Negative.  Negative for myalgias.  Neurological: Negative.  Negative for dizziness, focal weakness, seizures and headaches.  Psychiatric/Behavioral: Negative for suicidal ideas. The patient is nervous/anxious and has insomnia (With complete resolution).     Past Medical History:  Diagnosis Date  . Endometriosis     Past Surgical History:  Procedure Laterality Date  . BREAST EXCISIONAL BIOPSY Left    fibroadenoma  . BREAST SURGERY     tumor (benign) left breast removal   . CESAREAN SECTION      No family history on file.  Social History Reviewed with no changes to be made today.   Outpatient Medications Prior to Visit  Medication Sig Dispense Refill  . traZODone (DESYREL) 100 MG tablet Take 1 tablet (100 mg total) by mouth at bedtime. 30 tablet 1   No facility-administered medications prior to visit.     No Known Allergies  Objective:    BP 117/78   Pulse 70   Temp (!) 97.5 F (36.4 C) (Temporal)   Resp 17   Wt 101 lb (45.8 kg)   SpO2 98%   BMI 17.89 kg/m  Wt Readings from Last 3 Encounters:  08/08/19 101 lb (45.8 kg)  04/23/19 96 lb 6.4 oz (43.7 kg)  11/14/18 98 lb 9.6 oz (44.7 kg)    Physical Exam Vitals signs and nursing note reviewed.  Constitutional:      Appearance: She is well-developed.  HENT:     Head: Normocephalic and atraumatic.  Neck:     Musculoskeletal: Normal range of motion.  Cardiovascular:     Rate and Rhythm: Normal rate and regular rhythm.     Heart sounds: Normal heart sounds. No  murmur. No friction rub. No gallop.   Pulmonary:     Effort: Pulmonary effort is normal. No tachypnea or respiratory distress.     Breath sounds: Normal breath sounds. No decreased breath sounds, wheezing, rhonchi or rales.  Chest:     Chest wall: No tenderness.  Abdominal:     General: Bowel sounds are normal.     Palpations: Abdomen is soft.  Musculoskeletal: Normal range of motion.  Skin:    General: Skin is warm and dry.  Neurological:     Mental Status: She is alert and oriented to person, place, and time.     Coordination: Coordination normal.  Psychiatric:        Attention and Perception: Attention and perception normal.        Mood and Affect: Mood and affect normal.        Speech: Speech normal.        Behavior: Behavior normal. Behavior is cooperative.        Thought Content: Thought content normal.        Cognition and Memory: Cognition and memory normal.        Judgment: Judgment normal.          Patient has been counseled extensively about nutrition and exercise as well as the importance of adherence with medications and regular follow-up. The patient was given clear instructions to go to ER or return to medical center if symptoms don't improve, worsen or new problems develop. The patient verbalized understanding.   Follow-up: Return in about 2 weeks (around 08/22/2019) for @1110  PAP and wellness exam.   Gildardo Pounds, FNP-BC [facility] and [place], [place]   08/08/2019, 12:09 PM

## 2019-08-22 ENCOUNTER — Encounter: Payer: Self-pay | Admitting: Nurse Practitioner

## 2019-08-22 ENCOUNTER — Other Ambulatory Visit: Payer: Self-pay

## 2019-08-22 ENCOUNTER — Other Ambulatory Visit (HOSPITAL_COMMUNITY)
Admission: RE | Admit: 2019-08-22 | Discharge: 2019-08-22 | Disposition: A | Discharge status: Home / Self Care | Payer: No Typology Code available for payment source | Source: Ambulatory Visit | Attending: Nurse Practitioner | Admitting: Nurse Practitioner

## 2019-08-22 ENCOUNTER — Ambulatory Visit (INDEPENDENT_AMBULATORY_CARE_PROVIDER_SITE_OTHER): Payer: No Typology Code available for payment source | Admitting: Nurse Practitioner

## 2019-08-22 VITALS — BP 115/77 | HR 76 | Temp 97.5°F | Resp 17 | Wt 99.8 lb

## 2019-08-22 DIAGNOSIS — G4709 Other insomnia: Secondary | ICD-10-CM

## 2019-08-22 DIAGNOSIS — Z124 Encounter for screening for malignant neoplasm of cervix: Secondary | ICD-10-CM | POA: Diagnosis present

## 2019-08-22 DIAGNOSIS — F411 Generalized anxiety disorder: Secondary | ICD-10-CM | POA: Diagnosis not present

## 2019-08-22 DIAGNOSIS — N898 Other specified noninflammatory disorders of vagina: Secondary | ICD-10-CM | POA: Diagnosis not present

## 2019-08-22 NOTE — Progress Notes (Signed)
Assessment & Plan:  Rachael KaufmannMelissa was seen today for anxiety and insomnia.  Diagnoses and all orders for this visit:  Encounter for Papanicolaou smear for cervical cancer screening -     Cytology - PAP([place]) -     Cervicovaginal ancillary only    Patient has been counseled on age-appropriate routine health concerns for screening and prevention. These are reviewed and up-to-date. Referrals have been placed accordingly. Immunizations are up-to-date or declined.    Subjective:   Chief Complaint  Patient presents with  . Anxiety  . Insomnia   HPI Rachael Sherman 33 y.o. female presents to office today for follow up to anxiety and insomnia. She is also requesting a PCP smear today.   Anxiety and insomnia Taking hydroxyzine 25 mg 3 times daily as needed and trazodone 50 mg nightly as needed.  She is taking half a tablet of trazodone as needed instead of 100-minute nightly based on personal preference.  Today she reports significant improvement in her anxiety symptoms as well as her insomnia with both medications.  Review of Systems  Constitutional: Negative for fever, malaise/fatigue and weight loss.  Respiratory: Negative.  Negative for cough and shortness of breath.   Cardiovascular: Negative.  Negative for chest pain, palpitations and leg swelling.  Gastrointestinal: Negative.  Negative for heartburn, nausea and vomiting.  Genitourinary:       Abnormal vaginal odor  Neurological: Negative.  Negative for dizziness, focal weakness, seizures and headaches.  Psychiatric/Behavioral: Negative for suicidal ideas. The patient is nervous/anxious and has insomnia.     Past Medical History:  Diagnosis Date  . Endometriosis     Past Surgical History:  Procedure Laterality Date  . BREAST EXCISIONAL BIOPSY Left    fibroadenoma  . BREAST SURGERY     tumor (benign) left breast removal   . CESAREAN SECTION      No family history on file.  Social History Reviewed with no changes to  be made today.   Outpatient Medications Prior to Visit  Medication Sig Dispense Refill  . hydrOXYzine (ATARAX/VISTARIL) 25 MG tablet Take 1 tablet (25 mg total) by mouth 3 (three) times daily as needed. 60 tablet 1  . traZODone (DESYREL) 100 MG tablet Take 1 tablet (100 mg total) by mouth at bedtime. 30 tablet 1   No facility-administered medications prior to visit.     No Known Allergies     Objective:    BP 115/77   Pulse 76   Temp (!) 97.5 F (36.4 C) (Temporal)   Resp 17   Wt 99 lb 12.8 oz (45.3 kg)   SpO2 96%   BMI 17.68 kg/m  Wt Readings from Last 3 Encounters:  08/22/19 99 lb 12.8 oz (45.3 kg)  08/08/19 101 lb (45.8 kg)  04/23/19 96 lb 6.4 oz (43.7 kg)    Physical Exam Constitutional:      Appearance: She is well-developed.  HENT:     Head: Normocephalic.  Cardiovascular:     Rate and Rhythm: Normal rate and regular rhythm.     Heart sounds: Normal heart sounds.  Pulmonary:     Effort: Pulmonary effort is normal.     Breath sounds: Normal breath sounds.  Abdominal:     General: Bowel sounds are normal.     Palpations: Abdomen is soft.     Hernia: There is no hernia in the left inguinal area.  Genitourinary:    Labia:        Right: No rash, tenderness, lesion  or injury.        Left: No rash, tenderness, lesion or injury.      Vagina: No signs of injury and foreign body. Bleeding (Menstrual) present. No vaginal discharge, erythema or tenderness.     Cervix: Cervical bleeding (There is noticeable menstrual blood in the vaginal vault as well as surrounding the cervix.) present. No cervical motion tenderness or friability.     Uterus: Not deviated and not enlarged.      Adnexa:        Right: No mass, tenderness or fullness.         Left: No mass, tenderness or fullness.       Rectum: Normal. No external hemorrhoid.  Lymphadenopathy:     Lower Body: No right inguinal adenopathy. No left inguinal adenopathy.  Skin:    General: Skin is warm and dry.   Neurological:     Mental Status: She is alert and oriented to person, place, and time.  Psychiatric:        Behavior: Behavior normal.        Thought Content: Thought content normal.        Judgment: Judgment normal.          Patient has been counseled extensively about nutrition and exercise as well as the importance of adherence with medications and regular follow-up. The patient was given clear instructions to go to ER or return to medical center if symptoms don't improve, worsen or new problems develop. The patient verbalized understanding.   Follow-up: No follow-ups on file.   Gildardo Pounds, FNP-BC [facility] and [facility] [address], [place]   08/22/2019, 12:28 PM

## 2019-08-22 NOTE — Progress Notes (Signed)
Following up on anxiety & insomnia. States that both are much better.

## 2019-08-24 LAB — CYTOLOGY - PAP
Diagnosis: NEGATIVE
HPV: DETECTED — AB

## 2019-08-26 ENCOUNTER — Encounter: Payer: Self-pay | Admitting: Nurse Practitioner

## 2019-08-27 ENCOUNTER — Encounter: Payer: Self-pay | Admitting: Nurse Practitioner

## 2019-08-27 ENCOUNTER — Other Ambulatory Visit: Payer: Self-pay | Admitting: Nurse Practitioner

## 2019-08-27 LAB — CERVICOVAGINAL ANCILLARY ONLY
Candida vaginitis: NEGATIVE
Chlamydia: NEGATIVE
Neisseria Gonorrhea: NEGATIVE
Trichomonas: NEGATIVE

## 2019-08-27 MED ORDER — METRONIDAZOLE 500 MG PO TABS: 500.0000 mg | ORAL_TABLET | Freq: Two times a day (BID) | ORAL | 0 refills | Status: AC

## 2019-08-28 ENCOUNTER — Other Ambulatory Visit: Payer: Self-pay | Admitting: Nurse Practitioner

## 2019-08-28 MED ORDER — FLUCONAZOLE 150 MG PO TABS: 150.0000 mg | ORAL_TABLET | Freq: Once | ORAL | 1 refills | Status: AC

## 2019-08-28 MED FILL — metroNIDAZOLE 500 MG TABS: 500 | 7 days supply | Qty: 14 | Fill #0

## 2019-08-28 MED FILL — FLUCONAZOLE 150 MG TABLET: 150 | 1 days supply | Qty: 1 | Fill #0

## 2019-09-14 ENCOUNTER — Telehealth (HOSPITAL_COMMUNITY): Payer: Self-pay | Admitting: Family Medicine

## 2019-09-14 MED ORDER — ONDANSETRON 8 MG PO TBDP: 8.0000 mg | ORAL_TABLET | Freq: Three times a day (TID) | ORAL | 0 refills | Status: DC | PRN

## 2019-09-14 MED ORDER — IBUPROFEN 800 MG PO TABS: 800.0000 mg | ORAL_TABLET | Freq: Three times a day (TID) | ORAL | 0 refills | Status: DC

## 2019-09-14 MED FILL — ONDANSETRON ODT 8 MG TABLET: 8 | 4 days supply | Qty: 12 | Fill #0

## 2019-09-14 MED FILL — IBUPROFEN 800 MG TABS: 800 | 7 days supply | Qty: 21 | Fill #0

## 2019-09-14 NOTE — Telephone Encounter (Signed)
Patient's problems discussed

## 2019-10-12 ENCOUNTER — Other Ambulatory Visit: Payer: Self-pay | Admitting: Nurse Practitioner

## 2019-10-12 ENCOUNTER — Encounter: Payer: Self-pay | Admitting: Nurse Practitioner

## 2019-10-12 MED ORDER — ONDANSETRON 8 MG PO TBDP: 8.0000 mg | ORAL_TABLET | Freq: Three times a day (TID) | ORAL | 0 refills | Status: DC | PRN

## 2019-10-12 MED FILL — ONDANSETRON ODT 8 MG TABLET: 8 | 10 days supply | Qty: 30 | Fill #0

## 2019-10-24 ENCOUNTER — Encounter: Payer: No Typology Code available for payment source | Admitting: Nurse Practitioner

## 2019-10-24 NOTE — Progress Notes (Signed)
I spoke to Rachael Sherman via WebEx today.  Apparently this appointment was made back in April with her previous PCP for BP recheck.  Since then I have seen Rachael Sherman several times here in the office today recent visit 08/22/2019 for Pap and both times blood pressure has been normal.  There is no need for WebEx visit today and Rachael Sherman states she has no questions, concerns or complaints today.

## 2019-11-05 ENCOUNTER — Other Ambulatory Visit: Payer: Self-pay | Admitting: Nurse Practitioner

## 2019-11-05 ENCOUNTER — Encounter: Payer: Self-pay | Admitting: Nurse Practitioner

## 2019-11-05 MED ORDER — IBUPROFEN 800 MG PO TABS: 800.0000 mg | ORAL_TABLET | Freq: Three times a day (TID) | ORAL | 1 refills | Status: AC | PRN

## 2019-11-05 MED FILL — IBUPROFEN 800 MG TABS: 800 | 20 days supply | Qty: 60 | Fill #0

## 2019-12-25 ENCOUNTER — Encounter: Payer: Self-pay | Admitting: Nurse Practitioner

## 2019-12-25 ENCOUNTER — Other Ambulatory Visit: Payer: Self-pay | Admitting: Nurse Practitioner

## 2019-12-25 MED ORDER — TRAMADOL HCL 50 MG PO TABS: 50.0000 mg | ORAL_TABLET | Freq: Two times a day (BID) | ORAL | 0 refills | Status: AC | PRN

## 2019-12-25 MED ORDER — VALACYCLOVIR HCL 1 G PO TABS: 2000.0000 mg | ORAL_TABLET | Freq: Two times a day (BID) | ORAL | 1 refills | Status: AC

## 2019-12-25 MED FILL — IBUPROFEN 800 MG TABS: 800 | 20 days supply | Qty: 60 | Fill #0

## 2019-12-25 MED FILL — traMADol HCL 50 MG TABS: 50 | 7 days supply | Qty: 15 | Fill #0

## 2019-12-25 MED FILL — valACYclovir HCL 1 GM TABS: 1 | 1 days supply | Qty: 4 | Fill #0

## 2019-12-31 ENCOUNTER — Encounter (HOSPITAL_COMMUNITY): Payer: Self-pay

## 2019-12-31 ENCOUNTER — Other Ambulatory Visit: Payer: Self-pay

## 2019-12-31 ENCOUNTER — Telehealth: Payer: Self-pay

## 2019-12-31 ENCOUNTER — Ambulatory Visit (HOSPITAL_COMMUNITY)
Admission: EM | Admit: 2019-12-31 | Discharge: 2019-12-31 | Disposition: A | Discharge status: Home / Self Care | Payer: No Typology Code available for payment source | Attending: Family Medicine | Admitting: Family Medicine

## 2019-12-31 ENCOUNTER — Encounter: Payer: Self-pay | Admitting: Nurse Practitioner

## 2019-12-31 DIAGNOSIS — R030 Elevated blood-pressure reading, without diagnosis of hypertension: Secondary | ICD-10-CM | POA: Insufficient documentation

## 2019-12-31 LAB — COMPREHENSIVE METABOLIC PANEL
ALT: 12 U/L (ref 0–44)
AST: 12 U/L — ABNORMAL LOW (ref 15–41)
Albumin: 3.7 g/dL (ref 3.5–5.0)
Alkaline Phosphatase: 45 U/L (ref 38–126)
Anion gap: 6 (ref 5–15)
BUN: 11 mg/dL (ref 6–20)
CO2: 28 mmol/L (ref 22–32)
Calcium: 9.2 mg/dL (ref 8.9–10.3)
Chloride: 106 mmol/L (ref 98–111)
Creatinine, Ser: 0.75 mg/dL (ref 0.44–1.00)
GFR calc Af Amer: >60 mL/min (ref >60)
GFR calc non Af Amer: >60 mL/min (ref >60)
Glucose, Bld: 97 mg/dL (ref 70–99)
Potassium: 4.1 mmol/L (ref 3.5–5.1)
Sodium: 140 mmol/L (ref 135–145)
Total Bilirubin: 0.8 mg/dL (ref 0.3–1.2)
Total Protein: 7.2 g/dL (ref 6.5–8.1)

## 2019-12-31 LAB — CBC
HCT: 37.4 % (ref 36.0–46.0)
Hemoglobin: 12.1 g/dL (ref 12.0–15.0)
MCH: 27.3 pg (ref 26.0–34.0)
MCHC: 32.4 g/dL (ref 30.0–36.0)
MCV: 84.2 fL (ref 80.0–100.0)
Platelets: 251 10*3/uL (ref 150–400)
RBC: 4.44 MIL/uL (ref 3.87–5.11)
RDW: 11.9 % (ref 11.5–15.5)
WBC: 4.9 10*3/uL (ref 4.0–10.5)
nRBC: 0 % (ref 0.0–0.2)

## 2019-12-31 NOTE — ED Triage Notes (Signed)
Pt here to have blood work drawn for evaluation of her BP. Orders received from [place], NP.

## 2019-12-31 NOTE — Telephone Encounter (Signed)
Pt sent a MyChart message please respond

## 2020-01-01 ENCOUNTER — Telehealth (HOSPITAL_COMMUNITY): Payer: Self-pay | Admitting: Family Medicine

## 2020-01-01 MED ORDER — AMLODIPINE BESYLATE 5 MG PO TABS: 5.0000 mg | ORAL_TABLET | Freq: Every day | ORAL | 0 refills | Status: DC

## 2020-01-01 MED FILL — AMLODIPINE BESYLATE 5 MG TA: 5 | 30 days supply | Qty: 30 | Fill #0

## 2020-01-01 NOTE — Telephone Encounter (Signed)
Pt with new onset hypertension and 3 documented BPs Sunday 01/03  160/107 Monday morning 01/04   174/112 Monday evening  01/04   159/100 Better today at 139/100 Has had some mild chest pressure and headache Lab work normal and EKG normal No concerns for CVA.   Will start her on low does BP med, Amlodipine 5 mg and have her follow up with her PCP this week for recheck.

## 2020-01-03 ENCOUNTER — Encounter: Payer: Self-pay | Admitting: Nurse Practitioner

## 2020-01-03 NOTE — Telephone Encounter (Signed)
It appears she has been seen in the ER

## 2020-01-07 ENCOUNTER — Other Ambulatory Visit: Payer: Self-pay

## 2020-01-07 ENCOUNTER — Ambulatory Visit (HOSPITAL_COMMUNITY)
Admission: EM | Admit: 2020-01-07 | Discharge: 2020-01-07 | Disposition: A | Discharge status: Home / Self Care | Payer: No Typology Code available for payment source | Attending: Internal Medicine | Admitting: Internal Medicine

## 2020-01-07 ENCOUNTER — Encounter (HOSPITAL_COMMUNITY): Payer: Self-pay

## 2020-01-07 ENCOUNTER — Encounter: Payer: Self-pay | Admitting: Nurse Practitioner

## 2020-01-07 DIAGNOSIS — I10 Essential (primary) hypertension: Secondary | ICD-10-CM | POA: Diagnosis present

## 2020-01-07 LAB — POCT URINALYSIS DIP (DEVICE)
Bilirubin Urine: NEGATIVE
Glucose, UA: NEGATIVE mg/dL
Hgb urine dipstick: NEGATIVE
Ketones, ur: NEGATIVE mg/dL
Leukocytes,Ua: NEGATIVE
Nitrite: NEGATIVE
Protein, ur: NEGATIVE mg/dL
Specific Gravity, Urine: >=1.03 (ref 1.005–1.030)
Urobilinogen, UA: 0.2 mg/dL (ref 0.0–1.0)
pH: 6 (ref 5.0–8.0)

## 2020-01-07 LAB — TSH: TSH: 2.128 u[IU]/mL (ref 0.350–4.500)

## 2020-01-07 LAB — LIPID PANEL
Cholesterol: 191 mg/dL (ref 0–200)
HDL: 83 mg/dL (ref >40)
LDL Cholesterol: 100 mg/dL — ABNORMAL HIGH (ref 0–99)
Total CHOL/HDL Ratio: 2.3 RATIO
Triglycerides: 38 mg/dL (ref <150)
VLDL: 8 mg/dL (ref 0–40)

## 2020-01-07 NOTE — ED Provider Notes (Signed)
[facility]    CSN: [id number] Arrival date & time: 01/07/20  1922      History   Chief Complaint No chief complaint on file.   HPI Rachael Sherman is a 34 y.o. female presents to the urgent care for labs following evaluation by a provider for elevated blood pressure.  She is currently on amlodipine and she is tolerating it well.  She is here for lipid profile, urinalysis and TSH  HPI  Past Medical History:  Diagnosis Date  . Endometriosis     There are no problems to display for this patient.   Past Surgical History:  Procedure Laterality Date  . BREAST EXCISIONAL BIOPSY Left    fibroadenoma  . BREAST SURGERY     tumor (benign) left breast removal   . CESAREAN SECTION      OB History   No obstetric history on file.      Home Medications    Prior to Admission medications   Medication Sig Start Date End Date Taking? Authorizing Provider  amLODipine (NORVASC) 5 MG tablet Take 1 tablet (5 mg total) by mouth daily. 01/01/20   Dahlia Byes A, NP  hydrOXYzine (ATARAX/VISTARIL) 25 MG tablet Take 1 tablet (25 mg total) by mouth 3 (three) times daily as needed. 08/08/19   Claiborne Rigg, NP  ondansetron (ZOFRAN-ODT) 8 MG disintegrating tablet Take 1 tablet (8 mg total) by mouth every 8 (eight) hours as needed for nausea. 10/12/19   Claiborne Rigg, NP  traZODone (DESYREL) 100 MG tablet Take 1 tablet (100 mg total) by mouth at bedtime. 07/18/19   Claiborne Rigg, NP    Family History No family history on file.  Social History Social History   Tobacco Use  . Smoking status: Never Smoker  . Smokeless tobacco: Never Used  Substance Use Topics  . Alcohol use: Yes    Comment: socially  . Drug use: No     Allergies   Patient has no known allergies.   Review of Systems Review of Systems  Constitutional: Negative.   Respiratory: Negative.      Physical Exam Triage Vital Signs ED Triage Vitals  Enc Vitals Group     BP      Pulse      Resp     Temp      Temp src      SpO2      Weight      Height      Head Circumference      Peak Flow      Pain Score      Pain Loc      Pain Edu?      Excl. in GC?    No data found.  Updated Vital Signs There were no vitals taken for this visit.  Visual Acuity Right Eye Distance:   Left Eye Distance:   Bilateral Distance:    Right Eye Near:   Left Eye Near:    Bilateral Near:     Physical Exam Cardiovascular:     Rate and Rhythm: Normal rate and regular rhythm.     Pulses: Normal pulses.     Heart sounds: Normal heart sounds.  Pulmonary:     Effort: Pulmonary effort is normal.     Breath sounds: Normal breath sounds.  Skin:    Capillary Refill: Capillary refill takes less than 2 seconds.      UC Treatments / Results  Labs (all labs ordered are listed, but  only abnormal results are displayed) Labs Reviewed  TSH  LIPID PANEL    EKG   Radiology No results found.  Procedures Procedures (including critical care time)  Medications Ordered in UC Medications - No data to display  Initial Impression / Assessment and Plan / UC Course  I have reviewed the triage vital signs and the nursing notes.  Pertinent labs & imaging results that were available during my care of the patient were reviewed by me and considered in my medical decision making (see chart for details).     1.  Hypertension: TSH, urinalysis with protein Lipid profile Continue amlodipine  Final Clinical Impressions(s) / UC Diagnoses   Final diagnoses:  Essential hypertension   Discharge Instructions   None    ED Prescriptions    None     PDMP not reviewed this encounter.   Chase Picket, MD 01/07/20 2001

## 2020-01-07 NOTE — ED Triage Notes (Signed)
Pt here for blood draw, [place] MD aware, orders received.

## 2020-01-29 ENCOUNTER — Other Ambulatory Visit: Payer: Self-pay | Admitting: Nurse Practitioner

## 2020-01-29 DIAGNOSIS — R079 Chest pain, unspecified: Secondary | ICD-10-CM

## 2020-01-31 ENCOUNTER — Other Ambulatory Visit: Payer: Self-pay

## 2020-01-31 ENCOUNTER — Ambulatory Visit (INDEPENDENT_AMBULATORY_CARE_PROVIDER_SITE_OTHER): Payer: No Typology Code available for payment source | Admitting: Cardiovascular Disease

## 2020-01-31 ENCOUNTER — Encounter: Payer: Self-pay | Admitting: Cardiovascular Disease

## 2020-01-31 VITALS — BP 134/95 | HR 61 | Ht 62.0 in | Wt 98.8 lb

## 2020-01-31 DIAGNOSIS — R0789 Other chest pain: Secondary | ICD-10-CM

## 2020-01-31 DIAGNOSIS — I10 Essential (primary) hypertension: Secondary | ICD-10-CM

## 2020-01-31 NOTE — Patient Instructions (Addendum)
Medication Instructions:  NO CHANGE *If you need a refill on your cardiac medications before your next appointment, please call your pharmacy*  Lab Work: If you have labs (blood work) drawn today and your tests are completely normal, you will receive your results only by: Marland Kitchen MyChart Message (if you have MyChart) OR . A paper copy in the mail If you have any lab test that is abnormal or we need to change your treatment, we will call you to review the results.  Testing/Procedures: Your physician has requested that you have a renal artery duplex. During this test, an ultrasound is used to evaluate blood flow to the kidneys. Allow one hour for this exam. Do not eat after midnight the day before and avoid carbonated beverages. Take your medications as you usually do.[facility]  CORONARY CALCIUM SCORE AT [address]    Follow-Up: At [facility], you and your health needs are our priority.  As part of our continuing mission to provide you with exceptional heart care, we have created designated Provider Care Teams.  These Care Teams include your primary Cardiologist (physician) and Advanced Practice Providers (APPs -  Physician Assistants and Nurse Practitioners) who all work together to provide you with the care you need, when you need it.  Your next appointment:   3 week(s)  The format for your next appointment:   In Person  Provider:   Nanetta Batty, MD

## 2020-01-31 NOTE — Assessment & Plan Note (Signed)
History of atypical chest pain which began approximately a month ago coincident with the onset of her hypertension.  The pain was constant for multiple days but since she has been on her amlodipine is not episodic.  She is not had pain in the few days but when it has come on is lasted for hours at a time without radiation or associated symptoms.  She really has no risk factors for ischemic heart disease.  I am to get a coronary calcium score to further risk stratify

## 2020-01-31 NOTE — Assessment & Plan Note (Signed)
Rachael Sherman has just noticed elevated blood pressure over the last month.  She was started on amlodipine 2 weeks ago, 5 mg, with improvement in her blood pressure.  She does not eat salt.  We will get a renal Doppler study to rule out renal artery FMD.

## 2020-01-31 NOTE — Progress Notes (Signed)
01/31/2020 Leasa Kincannon   1986-09-29  [id number]  Primary Physician Bing Neighbors, FNP Primary Cardiologist: Runell Gess MD Nicholes Calamity, [place]  HPI:  Rachael Sherman is a 34 y.o. 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 Bertram Denver for cardiovascular valuation because of atypical chest pain hypertension.  She really has no risk factors.  She is adopted so does not know family history.  She has had atypical chest pain and hypertension for a month.  She was recently started on amlodipine.  Originally the chest pain was constant notes episodic lasting for hours at a time.  Her blood pressures been under better control on amlodipine 5 mg a day.   Current Meds  Medication Sig  . amLODipine (NORVASC) 5 MG tablet Take 1 tablet (5 mg total) by mouth daily.  . hydrOXYzine (ATARAX/VISTARIL) 25 MG tablet Take 1 tablet (25 mg total) by mouth 3 (three) times daily as needed.  . ondansetron (ZOFRAN-ODT) 8 MG disintegrating tablet Take 1 tablet (8 mg total) by mouth every 8 (eight) hours as needed for nausea.  . traZODone (DESYREL) 100 MG tablet Take 1 tablet (100 mg total) by mouth at bedtime.     No Known Allergies  Social History   Socioeconomic History  . Marital status: Single    Spouse name: Not on file  . Number of children: Not on file  . Years of education: Not on file  . Highest education level: Not on file  Occupational History  . Not on file  Tobacco Use  . Smoking status: Never Smoker  . Smokeless tobacco: Never Used  Substance and Sexual Activity  . Alcohol use: Yes    Comment: socially  . Drug use: No  . Sexual activity: Not on file  Other Topics Concern  . Not on file  Social History Narrative  . Not on file   Social Determinants of Health   Financial Resource Strain:   . Difficulty of Paying Living Expenses: Not on file  Food Insecurity:   .  Worried About Programme researcher, broadcasting/film/video in the Last Year: Not on file  . Ran Out of Food in the Last Year: Not on file  Transportation Needs:   . Lack of Transportation (Medical): Not on file  . Lack of Transportation (Non-Medical): Not on file  Physical Activity:   . Days of Exercise per Week: Not on file  . Minutes of Exercise per Session: Not on file  Stress:   . Feeling of Stress : Not on file  Social Connections:   . Frequency of Communication with Friends and Family: Not on file  . Frequency of Social Gatherings with Friends and Family: Not on file  . Attends Religious Services: Not on file  . Active Member of Clubs or Organizations: Not on file  . Attends Banker Meetings: Not on file  . Marital Status: Not on file  Intimate Partner Violence:   . Fear of Current or Ex-Partner: Not on file  . Emotionally Abused: Not on file  . Physically Abused: Not on file  . Sexually Abused: Not on file     Review of Systems: General: negative for chills, fever, night sweats or weight changes.  Cardiovascular: negative for chest pain, dyspnea on exertion, edema, orthopnea, palpitations, paroxysmal nocturnal dyspnea or shortness of breath Dermatological: negative for rash Respiratory: negative for cough or wheezing  Urologic: negative for hematuria Abdominal: negative for nausea, vomiting, diarrhea, bright red blood per rectum, melena, or hematemesis Neurologic: negative for visual changes, syncope, or dizziness All other systems reviewed and are otherwise negative except as noted above.    Blood pressure (!) 134/95, pulse 61, height 5\' 2"  (1.575 m), weight 98 lb 12.8 oz (44.8 kg), SpO2 100 %.  General appearance: alert and no distress Neck: no adenopathy, no carotid bruit, no JVD, supple, symmetrical, trachea midline and thyroid not enlarged, symmetric, no tenderness/mass/nodules Lungs: clear to auscultation bilaterally Heart: regular rate and rhythm, S1, S2 normal, no murmur,  click, rub or gallop Extremities: extremities normal, atraumatic, no cyanosis or edema Pulses: 2+ and symmetric Skin: Skin color, texture, turgor normal. No rashes or lesions Neurologic: Alert and oriented X 3, normal strength and tone. Normal symmetric reflexes. Normal coordination and gait  EKG sinus rhythm at 61 without ST or T wave changes.  I personally reviewed this EKG.  ASSESSMENT AND PLAN:   Essential hypertension Ms. Enslin has just noticed elevated blood pressure over the last month.  She was started on amlodipine 2 weeks ago, 5 mg, with improvement in her blood pressure.  She does not eat salt.  We will get a renal Doppler study to rule out renal artery FMD.  Atypical chest pain History of atypical chest pain which began approximately a month ago coincident with the onset of her hypertension.  The pain was constant for multiple days but since she has been on her amlodipine is not episodic.  She is not had pain in the few days but when it has come on is lasted for hours at a time without radiation or associated symptoms.  She really has no risk factors for ischemic heart disease.  I am to get a coronary calcium score to further risk stratify      Lorretta Harp MD [facility], [facility] 01/31/2020 4:11 PM

## 2020-02-01 ENCOUNTER — Ambulatory Visit (HOSPITAL_COMMUNITY): Admission: RE | Admit: 2020-02-01 | Payer: No Typology Code available for payment source | Source: Ambulatory Visit

## 2020-02-03 ENCOUNTER — Telehealth: Payer: No Typology Code available for payment source | Admitting: Family

## 2020-02-03 DIAGNOSIS — N760 Acute vaginitis: Secondary | ICD-10-CM

## 2020-02-03 DIAGNOSIS — B9689 Other specified bacterial agents as the cause of diseases classified elsewhere: Secondary | ICD-10-CM | POA: Diagnosis not present

## 2020-02-03 MED ORDER — METRONIDAZOLE 500 MG PO TABS: 500.0000 mg | ORAL_TABLET | Freq: Two times a day (BID) | ORAL | 0 refills | Status: DC

## 2020-02-03 NOTE — Progress Notes (Signed)

## 2020-02-04 ENCOUNTER — Other Ambulatory Visit: Payer: Self-pay

## 2020-02-04 ENCOUNTER — Ambulatory Visit (HOSPITAL_COMMUNITY)
Admission: RE | Admit: 2020-02-04 | Discharge: 2020-02-04 | Disposition: A | Discharge status: Home / Self Care | Payer: No Typology Code available for payment source | Source: Ambulatory Visit | Attending: Cardiology | Admitting: Cardiology

## 2020-02-04 DIAGNOSIS — I10 Essential (primary) hypertension: Secondary | ICD-10-CM | POA: Diagnosis present

## 2020-02-04 MED FILL — metroNIDAZOLE 500 MG TABS: 500 | 7 days supply | Qty: 14 | Fill #0

## 2020-02-18 ENCOUNTER — Inpatient Hospital Stay: Admission: RE | Admit: 2020-02-18 | Payer: No Typology Code available for payment source | Source: Ambulatory Visit

## 2020-02-20 ENCOUNTER — Ambulatory Visit (INDEPENDENT_AMBULATORY_CARE_PROVIDER_SITE_OTHER): Payer: No Typology Code available for payment source | Admitting: Cardiovascular Disease

## 2020-02-20 ENCOUNTER — Encounter: Payer: Self-pay | Admitting: Cardiovascular Disease

## 2020-02-20 ENCOUNTER — Other Ambulatory Visit: Payer: Self-pay

## 2020-02-20 DIAGNOSIS — I10 Essential (primary) hypertension: Secondary | ICD-10-CM | POA: Diagnosis not present

## 2020-02-20 DIAGNOSIS — R0789 Other chest pain: Secondary | ICD-10-CM | POA: Diagnosis not present

## 2020-02-20 NOTE — Assessment & Plan Note (Signed)
History of atypical chest pain which does not sound ischemic.  Patient has a coronary calcium score scheduled in 2 weeks.

## 2020-02-20 NOTE — Progress Notes (Signed)
02/20/2020 Shayley Medlin   May 25, 1986  [id number]  Primary Physician Bing Neighbors, FNP Primary Cardiologist: Runell Gess MD Nicholes Calamity, [place]  HPI:  Rachael Sherman is a 34 y.o.  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 Bertram Denver for cardiovascular valuation because of atypical chest pain hypertension.    I last saw her in the office 01/31/2020.  She really has no risk factors.  She is adopted so does not know family history.  She has had atypical chest pain and hypertension for a month.  She was recently started on amlodipine.  Originally the chest pain was constant notes episodic lasting for hours at a time.  Her blood pressures been under better control on amlodipine 5 mg a day.  Since I saw her 3 weeks ago she has right renal Doppler studies that were performed that showed no evidence of renal artery stenosis.  Her blood pressures measured at home by herself were in the 120-130 range over 80-90, significantly improved since prior to starting amlodipine.  She is scheduled to have a coronary calcium score in several weeks to further evaluate her atypical chest pain.   Current Meds  Medication Sig  . amLODipine (NORVASC) 5 MG tablet Take 1 tablet (5 mg total) by mouth daily.  . hydrOXYzine (ATARAX/VISTARIL) 25 MG tablet Take 1 tablet (25 mg total) by mouth 3 (three) times daily as needed.  Marland Kitchen ibuprofen (ADVIL) 800 MG tablet Take 800 mg by mouth 3 (three) times daily.  . ondansetron (ZOFRAN-ODT) 8 MG disintegrating tablet Take 1 tablet (8 mg total) by mouth every 8 (eight) hours as needed for nausea.  . traZODone (DESYREL) 100 MG tablet Take 1 tablet (100 mg total) by mouth at bedtime.  . [DISCONTINUED] metroNIDAZOLE (FLAGYL) 500 MG tablet Take 1 tablet (500 mg total) by mouth 2 (two) times daily.     No Known Allergies  Social History   Socioeconomic History  .  Marital status: Single    Spouse name: Not on file  . Number of children: Not on file  . Years of education: Not on file  . Highest education level: Not on file  Occupational History  . Not on file  Tobacco Use  . Smoking status: Never Smoker  . Smokeless tobacco: Never Used  Substance and Sexual Activity  . Alcohol use: Yes    Comment: socially  . Drug use: No  . Sexual activity: Not on file  Other Topics Concern  . Not on file  Social History Narrative  . Not on file   Social Determinants of Health   Financial Resource Strain:   . Difficulty of Paying Living Expenses: Not on file  Food Insecurity:   . Worried About Programme researcher, broadcasting/film/video in the Last Year: Not on file  . Ran Out of Food in the Last Year: Not on file  Transportation Needs:   . Lack of Transportation (Medical): Not on file  . Lack of Transportation (Non-Medical): Not on file  Physical Activity:   . Days of Exercise per Week: Not on file  . Minutes of Exercise per Session: Not on file  Stress:   . Feeling of Stress : Not on file  Social Connections:   . Frequency of Communication with Friends and Family: Not on file  . Frequency of Social Gatherings with Friends and Family: Not on file  .  Attends Religious Services: Not on file  . Active Member of Clubs or Organizations: Not on file  . Attends Archivist Meetings: Not on file  . Marital Status: Not on file  Intimate Partner Violence:   . Fear of Current or Ex-Partner: Not on file  . Emotionally Abused: Not on file  . Physically Abused: Not on file  . Sexually Abused: Not on file     Review of Systems: General: negative for chills, fever, night sweats or weight changes.  Cardiovascular: negative for chest pain, dyspnea on exertion, edema, orthopnea, palpitations, paroxysmal nocturnal dyspnea or shortness of breath Dermatological: negative for rash Respiratory: negative for cough or wheezing Urologic: negative for hematuria Abdominal:  negative for nausea, vomiting, diarrhea, bright red blood per rectum, melena, or hematemesis Neurologic: negative for visual changes, syncope, or dizziness All other systems reviewed and are otherwise negative except as noted above.    Blood pressure 135/88, pulse 76, temperature 98.4 F (36.9 C), height 5\' 2"  (1.575 m), weight 99 lb 9.6 oz (45.2 kg), SpO2 99 %.  General appearance: alert and no distress Neck: no adenopathy, no carotid bruit, no JVD, supple, symmetrical, trachea midline and thyroid not enlarged, symmetric, no tenderness/mass/nodules Lungs: clear to auscultation bilaterally Heart: regular rate and rhythm, S1, S2 normal, no murmur, click, rub or gallop Extremities: extremities normal, atraumatic, no cyanosis or edema Pulses: 2+ and symmetric Skin: Skin color, texture, turgor normal. No rashes or lesions Neurologic: Alert and oriented X 3, normal strength and tone. Normal symmetric reflexes. Normal coordination and gait  EKG not performed today  ASSESSMENT AND PLAN:   Essential hypertension History of essential hypertension on low-dose amlodipine with blood pressures measured at home in the 120-130 range over 80-90.  Her blood pressure today in the office was 135/88.  Renal Dopplers did not show evidence of renal artery stenosis.   Atypical chest pain History of atypical chest pain which does not sound ischemic.  Patient has a coronary calcium score scheduled in 2 weeks.      Lorretta Harp MD FACP,FACC,FAHA, [facility] 02/20/2020 11:54 AM

## 2020-02-20 NOTE — Patient Instructions (Signed)
Medication Instructions:  Your physician recommends that you continue on your current medications as directed. Please refer to the Current Medication list given to you today.  If you need a refill on your cardiac medications before your next appointment, please call your pharmacy.   Lab work: NONE  Testing/Procedures: NONE  Follow-Up: At [facility], you and your health needs are our priority.  As part of our continuing mission to provide you with exceptional heart care, we have created designated Provider Care Teams.  These Care Teams include your primary Cardiologist (physician) and Advanced Practice Providers (APPs -  Physician Assistants and Nurse Practitioners) who all work together to provide you with the care you need, when you need it. You may see Dr. Allyson Sabal or one of the following Advanced Practice Providers on your designated Care Team:    Corine Shelter, PA-C  [place], [place]  Edd Fabian, [place]  Your physician wants you to follow-up in: 3 months with a physicians assistant and 6 months with Dr. Allyson Sabal. You will receive a reminder letter in the mail two months in advance. If you don't receive a letter, please call our office to schedule the follow-up appointment.

## 2020-02-20 NOTE — Assessment & Plan Note (Signed)
History of essential hypertension on low-dose amlodipine with blood pressures measured at home in the 120-130 range over 80-90.  Her blood pressure today in the office was 135/88.  Renal Dopplers did not show evidence of renal artery stenosis.

## 2020-03-04 ENCOUNTER — Other Ambulatory Visit: Payer: Self-pay | Admitting: Nurse Practitioner

## 2020-03-04 MED ORDER — ONDANSETRON 8 MG PO TBDP: 8.0000 mg | ORAL_TABLET | Freq: Three times a day (TID) | ORAL | 0 refills | Status: DC | PRN

## 2020-03-04 MED FILL — ONDANSETRON ODT 8 MG TABLET: 8 | 10 days supply | Qty: 30 | Fill #0

## 2020-03-17 ENCOUNTER — Other Ambulatory Visit: Payer: Self-pay

## 2020-03-17 ENCOUNTER — Ambulatory Visit (INDEPENDENT_AMBULATORY_CARE_PROVIDER_SITE_OTHER)
Admission: RE | Admit: 2020-03-17 | Discharge: 2020-03-17 | Disposition: A | Discharge status: Home / Self Care | Payer: Self-pay | Source: Ambulatory Visit | Attending: Cardiovascular Disease | Admitting: Cardiovascular Disease

## 2020-03-17 DIAGNOSIS — R0789 Other chest pain: Secondary | ICD-10-CM

## 2020-04-07 ENCOUNTER — Telehealth (INDEPENDENT_AMBULATORY_CARE_PROVIDER_SITE_OTHER): Payer: No Typology Code available for payment source | Admitting: Internal Medicine

## 2020-04-07 ENCOUNTER — Encounter: Payer: Self-pay | Admitting: Internal Medicine

## 2020-04-07 DIAGNOSIS — G43809 Other migraine, not intractable, without status migrainosus: Secondary | ICD-10-CM | POA: Diagnosis not present

## 2020-04-07 MED ORDER — UBRELVY 50 MG PO TABS: 1.0000 | ORAL_TABLET | Freq: Every day | ORAL | 0 refills | Status: DC | PRN

## 2020-04-07 MED ORDER — ONDANSETRON 8 MG PO TBDP: 8.0000 mg | ORAL_TABLET | Freq: Three times a day (TID) | ORAL | 0 refills | Status: DC | PRN

## 2020-04-07 MED FILL — ONDANSETRON ODT 8 MG TABLET: 8 | 10 days supply | Qty: 30 | Fill #0

## 2020-04-07 NOTE — Progress Notes (Signed)
Virtual Visit via Telephone Note  I connected with Rachael Sherman, on 04/07/2020 at 3:59 PM by telephone due to the COVID-19 pandemic and verified that I am speaking with the correct person using two identifiers.   Consent: I discussed the limitations, risks, security and privacy concerns of performing an evaluation and management service by telephone and the availability of in person appointments. I also discussed with the patient that there may be a patient responsible charge related to this service. The patient expressed understanding and agreed to proceed.   Location of Patient: Home  Location of Provider: Home    Persons participating in Telemedicine visit: Rachael Sherman [facility] Dr. Juleen China      History of Present Illness: Patient has a visit for concern for migraines. She has a history of migraines as a child/teen but thought she had grown out of it. She was taking Zomig in the past.   She reports that her headaches vary in location. She has associated photophobia, nausea. She had to leave work last week due to the severity of the pain. She has tried multiple OTC medications that will lessen the intensity but not abort them. In the past three weeks, she has had 2-3 per week. Has had two severe headaches where she was down for the day and couldn't do anything.    Past Medical History:  Diagnosis Date  . Endometriosis    No Known Allergies  Current Outpatient Medications on File Prior to Visit  Medication Sig Dispense Refill  . amLODipine (NORVASC) 5 MG tablet Take 1 tablet (5 mg total) by mouth daily. 30 tablet 0  . hydrOXYzine (ATARAX/VISTARIL) 25 MG tablet Take 1 tablet (25 mg total) by mouth 3 (three) times daily as needed. 60 tablet 1  . ibuprofen (ADVIL) 800 MG tablet Take 800 mg by mouth 3 (three) times daily.    . ondansetron (ZOFRAN-ODT) 8 MG disintegrating tablet Take 1 tablet (8 mg total) by mouth every 8 (eight) hours as needed for nausea. Will need to  follow up with Dr. Juleen China for additional refills 30 tablet 0  . traZODone (DESYREL) 100 MG tablet Take 1 tablet (100 mg total) by mouth at bedtime. 30 tablet 1   No current facility-administered medications on file prior to visit.    Observations/Objective: NAD. Speaking clearly.  Work of breathing normal.  Alert and oriented. Mood appropriate.   Assessment and Plan: 1. Other migraine without status migrainosus, not intractable Will prescribe abortive medication for migraines. Discussed when to use and not to exceed 200 mg in one 24 hour period. If not improved with abortive medication, would recommend considering daily prophylaxis.  - ondansetron (ZOFRAN-ODT) 8 MG disintegrating tablet; Take 1 tablet (8 mg total) by mouth every 8 (eight) hours as needed for nausea.  Dispense: 30 tablet; Refill: 0 - Ubrogepant (UBRELVY) 50 MG TABS; Take 1 tablet by mouth daily as needed. Take 1 tablet at the onset of migraine. Can repeat in 2 hours if still having headache. Do not exceed 200 mg in one day.  Dispense: 10 tablet; Refill: 0   Follow Up Instructions: PRN    I discussed the assessment and treatment plan with the patient. The patient was provided an opportunity to ask questions and all were answered. The patient agreed with the plan and demonstrated an understanding of the instructions.   The patient was advised to call back or seek an in-person evaluation if the symptoms worsen or if the condition fails to improve as anticipated.  I provided 12 minutes total of non-face-to-face time during this encounter including median intraservice time, reviewing previous notes, investigations, ordering medications, medical decision making, coordinating care and patient verbalized understanding at the end of the visit.    Marcy Siren, D.O. Primary Care at [facility]  04/07/2020, 3:59 PM

## 2020-04-08 MED FILL — UBRELVY 50 MG TABS: 50 | 30 days supply | Qty: 10 | Fill #0

## 2020-04-21 MED FILL — AMLODIPINE BESYLATE 5 MG TA: 5 | 30 days supply | Qty: 30 | Fill #0

## 2020-05-19 ENCOUNTER — Ambulatory Visit: Payer: No Typology Code available for payment source | Admitting: Cardiology

## 2020-05-22 ENCOUNTER — Telehealth: Payer: No Typology Code available for payment source | Admitting: Emergency Medicine

## 2020-05-22 DIAGNOSIS — R300 Dysuria: Secondary | ICD-10-CM | POA: Diagnosis not present

## 2020-05-22 MED ORDER — CEPHALEXIN 500 MG PO CAPS: 500.0000 mg | ORAL_CAPSULE | Freq: Two times a day (BID) | ORAL | 0 refills | Status: DC

## 2020-05-22 MED ORDER — FLUCONAZOLE 150 MG PO TABS: 150.0000 mg | ORAL_TABLET | Freq: Once | ORAL | 0 refills | Status: AC

## 2020-05-22 MED FILL — FLUCONAZOLE 150 MG TABLET: 150 | 1 days supply | Qty: 1 | Fill #0

## 2020-05-22 MED FILL — CEPHALEXIN 500 MG CAPSULE: 500 | 7 days supply | Qty: 14 | Fill #0

## 2020-05-22 NOTE — Progress Notes (Signed)
We are sorry that you are not feeling well.  Here is how we plan to help!  Based on what you shared with me it looks like you most likely have a simple urinary tract infection.  A UTI (Urinary Tract Infection) is a bacterial infection of the bladder.  Most cases of urinary tract infections are simple to treat but a key part of your care is to encourage you to drink plenty of fluids and watch your symptoms carefully.  I have prescribed Keflex 500 mg twice a day for 7 days and Diflucan 150mg  to be taken after the antibiotics for yeast infection.  If the discharge doesn't clear with this treatment, you should be seen in person.  Your symptoms should gradually improve. Call if the burning in your urine worsens, you develop worsening fever, back pain or pelvic pain or if your symptoms do not resolve after completing the antibiotic.  Urinary tract infections can be prevented by drinking plenty of water to keep your body hydrated.  Also be sure when you wipe, wipe from front to back and don't hold it in!  If possible, empty your bladder every 4 hours.  Your e-visit answers were reviewed by a board certified advanced clinical practitioner to complete your personal care plan.  Depending on the condition, your plan could have included both over the counter or prescription medications.  If there is a problem please reply  once you have received a response from your provider.  Your safety is important to [place].  If you have drug allergies check your prescription carefully.    You can use MyChart to ask questions about today's visit, request a non-urgent call back, or ask for a work or school excuse for 24 hours related to this e-Visit. If it has been greater than 24 hours you will need to follow up with your provider, or enter a new e-Visit to address those concerns.   You will get an e-mail in the next two days asking about your experience.  I hope that your e-visit has been valuable and will speed your  recovery. Thank you for using e-visits.  Approximately 5 minutes was used in reviewing the patient's chart, questionnaire, prescribing medications, and documentation.

## 2020-05-23 ENCOUNTER — Telehealth (INDEPENDENT_AMBULATORY_CARE_PROVIDER_SITE_OTHER): Payer: No Typology Code available for payment source | Admitting: Cardiology

## 2020-05-23 DIAGNOSIS — I10 Essential (primary) hypertension: Secondary | ICD-10-CM

## 2020-05-23 DIAGNOSIS — R0789 Other chest pain: Secondary | ICD-10-CM | POA: Diagnosis not present

## 2020-05-23 NOTE — Progress Notes (Signed)
Virtual Visit via Telephone Note   This visit type was conducted due to national recommendations for restrictions regarding the COVID-19 Pandemic (e.g. social distancing) in an effort to limit this patient's exposure and mitigate transmission in our community.  Due to her co-morbid illnesses, this patient is at least at moderate risk for complications without adequate follow up.  This format is felt to be most appropriate for this patient at this time.  The patient did not have access to video technology/had technical difficulties with video requiring transitioning to audio format only (telephone).  All issues noted in this document were discussed and addressed.  No physical exam could be performed with this format.  Please refer to the patient's chart for her  consent to telehealth for [facility].   The patient was identified using 2 identifiers.  Date:  05/23/2020   ID:  Rachael Sherman, DOB Aug 27, 1986, MRN [id number]  Patient Location: Home Provider Location: Home  PCP:  Bing Neighbors, FNP  Cardiologist:   Electrophysiologist:  None   Evaluation Performed:  Follow-Up Visit  Chief Complaint:  none  History of Present Illness:    Rachael Sherman is a 34 y.o. female with a history of HTN and chest pain.  She saw Dr Allyson Sabal in Feb 2021.  He felt her symptoms were atypical.  Coronary ca++ scoring was done and her score was 0.  She was contacted today for follow up.  She has been doing well, no chest pain.  She tells me her B/P is controlled but didn't have one recently.  The patient does not have symptoms concerning for COVID-19 infection (fever, chills, cough, or new shortness of breath).    Past Medical History:  Diagnosis Date  . Endometriosis    Past Surgical History:  Procedure Laterality Date  . BREAST EXCISIONAL BIOPSY Left    fibroadenoma  . BREAST SURGERY     tumor (benign) left breast removal   . CESAREAN SECTION       Current Meds  Medication Sig  . amLODipine  (NORVASC) 5 MG tablet Take 1 tablet (5 mg total) by mouth daily.  . cephALEXin (KEFLEX) 500 MG capsule Take 1 capsule (500 mg total) by mouth 2 (two) times daily.  . hydrOXYzine (ATARAX/VISTARIL) 25 MG tablet Take 1 tablet (25 mg total) by mouth 3 (three) times daily as needed.  Marland Kitchen ibuprofen (ADVIL) 800 MG tablet Take 800 mg by mouth 3 (three) times daily.  . ondansetron (ZOFRAN-ODT) 8 MG disintegrating tablet Take 1 tablet (8 mg total) by mouth every 8 (eight) hours as needed for nausea.  . traZODone (DESYREL) 100 MG tablet Take 1 tablet (100 mg total) by mouth at bedtime.  Marland Kitchen Ubrogepant (UBRELVY) 50 MG TABS Take 1 tablet by mouth daily as needed. Take 1 tablet at the onset of migraine. Can repeat in 2 hours if still having headache. Do not exceed 200 mg in one day.     Allergies:   Patient has no known allergies.   Social History   Tobacco Use  . Smoking status: Never Smoker  . Smokeless tobacco: Never Used  Substance Use Topics  . Alcohol use: Yes    Comment: socially  . Drug use: No     Family Hx: The patient's family history is not on file.  ROS:   Please see the history of present illness.     All other systems reviewed and are negative.   Prior CV studies:   The following studies were  reviewed today:  Coronary ca++ scoring March 2021  Labs/Other Tests and Data Reviewed:    EKG:  No ECG reviewed.  Recent Labs: 12/31/2019: ALT 12; BUN 11; Creatinine, Ser 0.75; Hemoglobin 12.1; Platelets 251; Potassium 4.1; Sodium 140 01/07/2020: TSH 2.128   Recent Lipid Panel Lab Results  Component Value Date/Time   CHOL 191 01/07/2020 08:00 PM   CHOL 187 04/23/2019 12:23 PM   TRIG 38 01/07/2020 08:00 PM   HDL 83 01/07/2020 08:00 PM   HDL 75 04/23/2019 12:23 PM   CHOLHDL 2.3 01/07/2020 08:00 PM   LDLCALC 100 (H) 01/07/2020 08:00 PM   LDLCALC 101 (H) 04/23/2019 12:23 PM    Wt Readings from Last 3 Encounters:  05/23/20 110 lb (49.9 kg)  02/20/20 99 lb 9.6 oz (45.2 kg)    01/31/20 98 lb 12.8 oz (44.8 kg)     Objective:    Vital Signs:  Ht 5\' 2"  (1.575 m)   Wt 110 lb (49.9 kg)   BMI 20.12 kg/m    VITAL SIGNS:  reviewed  ASSESSMENT & PLAN:    Chest pain- Non cardiac- Ca++ score 0, LDL 100, no FmHx- adopted.  HTN- F/u with PCP  Plan: Reassured- f/u PRN.  COVID-19 Education: The signs and symptoms of COVID-19 were discussed with the patient and how to seek care for testing (follow up with PCP or arrange E-visit).  The importance of social distancing was discussed today.  Time:   Today, I have spent 10 minutes with the patient with telehealth technology discussing the above problems.     Medication Adjustments/Labs and Tests Ordered: Current medicines are reviewed at length with the patient today.  Concerns regarding medicines are outlined above.   Tests Ordered: No orders of the defined types were placed in this encounter.   Medication Changes: No orders of the defined types were placed in this encounter.   Follow Up:  Either In Person or Virtual prn  Signed, Kerin Ransom, PA-C  05/23/2020 11:00 AM    [place]

## 2020-05-23 NOTE — Patient Instructions (Signed)
Your physician recommends that you continue on your current medications as directed. Please refer to the Current Medication list given to you today.    Your physician recommends that you schedule a follow-up appointment in:  AS NEEDED 

## 2020-06-13 MED FILL — AMLODIPINE BESYLATE 5 MG TA: 5 | 30 days supply | Qty: 30 | Fill #1

## 2020-06-16 ENCOUNTER — Other Ambulatory Visit: Payer: Self-pay

## 2020-06-16 DIAGNOSIS — G43809 Other migraine, not intractable, without status migrainosus: Secondary | ICD-10-CM

## 2020-06-16 MED ORDER — UBRELVY 50 MG PO TABS: 1.0000 | ORAL_TABLET | Freq: Every day | ORAL | 0 refills | Status: DC | PRN

## 2020-06-16 MED FILL — UBRELVY 50 MG TABS: 50 | 30 days supply | Qty: 10 | Fill #0

## 2020-06-30 ENCOUNTER — Telehealth: Payer: No Typology Code available for payment source | Admitting: Physician Assistant

## 2020-06-30 DIAGNOSIS — B001 Herpesviral vesicular dermatitis: Secondary | ICD-10-CM

## 2020-06-30 MED ORDER — VALACYCLOVIR HCL 1 G PO TABS: 2000.0000 mg | ORAL_TABLET | Freq: Two times a day (BID) | ORAL | 0 refills | Status: AC

## 2020-06-30 MED FILL — valACYclovir HCL 1 GM TABS: 1 | 1 days supply | Qty: 4 | Fill #0

## 2020-06-30 NOTE — Progress Notes (Signed)
We are sorry that you are not feeling well.  Here is how we plan to help!  Based on what you have shared with me it does look like you have a viral infection.    Most cold sores or fever blisters are small fluid filled blisters around the mouth caused by herpes simplex virus.  The most common strain of the virus causing cold sores is herpes simplex virus 1.  It can be spread by skin contact, sharing eating utensils, or even sharing towels.  Cold sores are contagious to other people until dry. (Approximately 5-7 days).  Wash your hands. You can spread the virus to your eyes through handling your contact lenses after touching the lesions.  Most people experience pain at the sight or tingling sensations in their lips that may begin before the ulcers erupt.  Herpes simplex is treatable but not curable.  It may lie dormant for a long time and then reappear due to stress or prolonged sun exposure.  Many patients have success in treating their cold sores with an over the counter topical called Abreva.  You may apply the cream up to 5 times daily (maximum 10 days) until healing occurs.  If you would like to use an oral antiviral medication to speed the healing of your cold sore, I have sent a prescription to your local pharmacy Valacyclovir 2 gm take one by mouth twice a day for 1 day    HOME CARE:   Wash your hands frequently.  Do not pick at or rub the sore.  Don't open the blisters.  Avoid kissing other people during this time.  Avoid sharing drinking glasses, eating utensils, or razors.  Do not handle contact lenses unless you have thoroughly washed your hands with soap and warm water!  Avoid oral sex during this time.  Herpes from sores on your mouth can spread to your partner's genital area.  Avoid contact with anyone who has eczema or a weakened immune system.  Cold sores are often triggered by exposure to intense sunlight, use a lip balm containing a sunscreen (SPF 30 or  higher).  GET HELP RIGHT AWAY IF:   Blisters look infected.  Blisters occur near or in the eye.  Symptoms last longer than 10 days.  Your symptoms become worse.  MAKE SURE YOU:   Understand these instructions.  Will watch your condition.  Will get help right away if you are not doing well or get worse.    Your e-visit answers were reviewed by a board certified advanced clinical practitioner to complete your personal care plan.  Depending upon the condition, your plan could have  Included both over the counter or prescription medications.    Please review your pharmacy choice.  Be sure that the pharmacy you have chosen is open so that you can pick up your prescription now.  If there is a problem you can message your provider in MyChart to have the prescription routed to another pharmacy.    Your safety is important to [place].  If you have drug allergies check our prescription carefully.  For the next 24 hours you can use MyChart to ask questions about today's visit, request a non-urgent call back, or ask for a work or school excuse from your e-visit provider.  You will get an email in the next two days asking about your experience.  I hope that your e-visit has been valuable and will speed your recovery.  5 minutes on chart

## 2020-07-06 ENCOUNTER — Telehealth: Payer: No Typology Code available for payment source | Admitting: Family

## 2020-07-06 DIAGNOSIS — B002 Herpesviral gingivostomatitis and pharyngotonsillitis: Secondary | ICD-10-CM

## 2020-07-06 MED ORDER — VALACYCLOVIR HCL 1 G PO TABS: 2000.0000 mg | ORAL_TABLET | Freq: Two times a day (BID) | ORAL | 2 refills | Status: DC

## 2020-07-06 MED ORDER — FLUCONAZOLE 150 MG PO TABS: 150.0000 mg | ORAL_TABLET | ORAL | 0 refills | Status: DC | PRN

## 2020-07-06 NOTE — Progress Notes (Signed)
We are sorry that you are not feeling well.  Here is how we plan to help!  Based on what you have shared with me it does look like you have a viral infection.    Most cold sores or fever blisters are small fluid filled blisters around the mouth caused by herpes simplex virus.  The most common strain of the virus causing cold sores is herpes simplex virus 1.  It can be spread by skin contact, sharing eating utensils, or even sharing towels.  Cold sores are contagious to other people until dry. (Approximately 5-7 days).  Wash your hands. You can spread the virus to your eyes through handling your contact lenses after touching the lesions.  Most people experience pain at the sight or tingling sensations in their lips that may begin before the ulcers erupt.  Herpes simplex is treatable but not curable.  It may lie dormant for a long time and then reappear due to stress or prolonged sun exposure.  Many patients have success in treating their cold sores with an over the counter topical called Abreva.  You may apply the cream up to 5 times daily (maximum 10 days) until healing occurs.  If you would like to use an oral antiviral medication to speed the healing of your cold sore, I have sent a prescription to your local pharmacy Valacyclovir 2 gm take one by mouth twice a day for 1 day. I have also sent Diflucan for your yeast infection.     HOME CARE:   Wash your hands frequently.  Do not pick at or rub the sore.  Don't open the blisters.  Avoid kissing other people during this time.  Avoid sharing drinking glasses, eating utensils, or razors.  Do not handle contact lenses unless you have thoroughly washed your hands with soap and warm water!  Avoid oral sex during this time.  Herpes from sores on your mouth can spread to your partner's genital area.  Avoid contact with anyone who has eczema or a weakened immune system.  Cold sores are often triggered by exposure to intense sunlight, use a lip  balm containing a sunscreen (SPF 30 or higher).  GET HELP RIGHT AWAY IF:   Blisters look infected.  Blisters occur near or in the eye.  Symptoms last longer than 10 days.  Your symptoms become worse.  MAKE SURE YOU:   Understand these instructions.  Will watch your condition.  Will get help right away if you are not doing well or get worse.    Your e-visit answers were reviewed by a board certified advanced clinical practitioner to complete your personal care plan.  Depending upon the condition, your plan could have  Included both over the counter or prescription medications.    Please review your pharmacy choice.  Be sure that the pharmacy you have chosen is open so that you can pick up your prescription now.  If there is a problem you can message your provider in MyChart to have the prescription routed to another pharmacy.    Your safety is important to [place].  If you have drug allergies check our prescription carefully.  For the next 24 hours you can use MyChart to ask questions about today's visit, request a non-urgent call back, or ask for a work or school excuse from your e-visit provider.  You will get an email in the next two days asking about your experience.  I hope that your e-visit has been valuable and will speed  your recovery.  Approximately 5 minutes was spent documenting and reviewing patient's chart.

## 2020-08-14 ENCOUNTER — Other Ambulatory Visit (HOSPITAL_COMMUNITY): Payer: Self-pay | Admitting: Family Medicine

## 2020-08-14 MED FILL — AMLODIPINE BESYLATE 5 MG TA: 5 | 30 days supply | Qty: 30 | Fill #0

## 2020-09-04 ENCOUNTER — Telehealth: Payer: No Typology Code available for payment source | Admitting: Emergency Medicine

## 2020-09-04 DIAGNOSIS — R300 Dysuria: Secondary | ICD-10-CM

## 2020-09-04 MED ORDER — CEPHALEXIN 500 MG PO CAPS: 500.0000 mg | ORAL_CAPSULE | Freq: Two times a day (BID) | ORAL | 0 refills | Status: DC

## 2020-09-04 MED FILL — CEPHALEXIN 500 MG CAPSULE: 500 | 7 days supply | Qty: 14 | Fill #0

## 2020-09-04 NOTE — Progress Notes (Signed)

## 2020-09-10 ENCOUNTER — Other Ambulatory Visit: Payer: Self-pay

## 2020-09-10 ENCOUNTER — Ambulatory Visit (HOSPITAL_COMMUNITY)
Admission: EM | Admit: 2020-09-10 | Discharge: 2020-09-10 | Disposition: A | Discharge status: Home / Self Care | Payer: No Typology Code available for payment source | Attending: Emergency Medicine | Admitting: Emergency Medicine

## 2020-09-10 DIAGNOSIS — Z1152 Encounter for screening for COVID-19: Secondary | ICD-10-CM

## 2020-09-16 ENCOUNTER — Other Ambulatory Visit: Payer: Self-pay | Admitting: Family

## 2020-09-19 IMAGING — MG DIGITAL DIAGNOSTIC BILATERAL MAMMOGRAM WITH TOMO AND CAD
6 of 10 series · 6 of 30 positions shown · non-contrast
Comparison: None.

CLINICAL DATA: 32-year-old female with focal right breast pain for
several weeks.

EXAM:
DIGITAL DIAGNOSTIC BILATERAL MAMMOGRAM WITH CAD AND TOMO
ULTRASOUND RIGHT BREAST

[R MLO synth-2D]
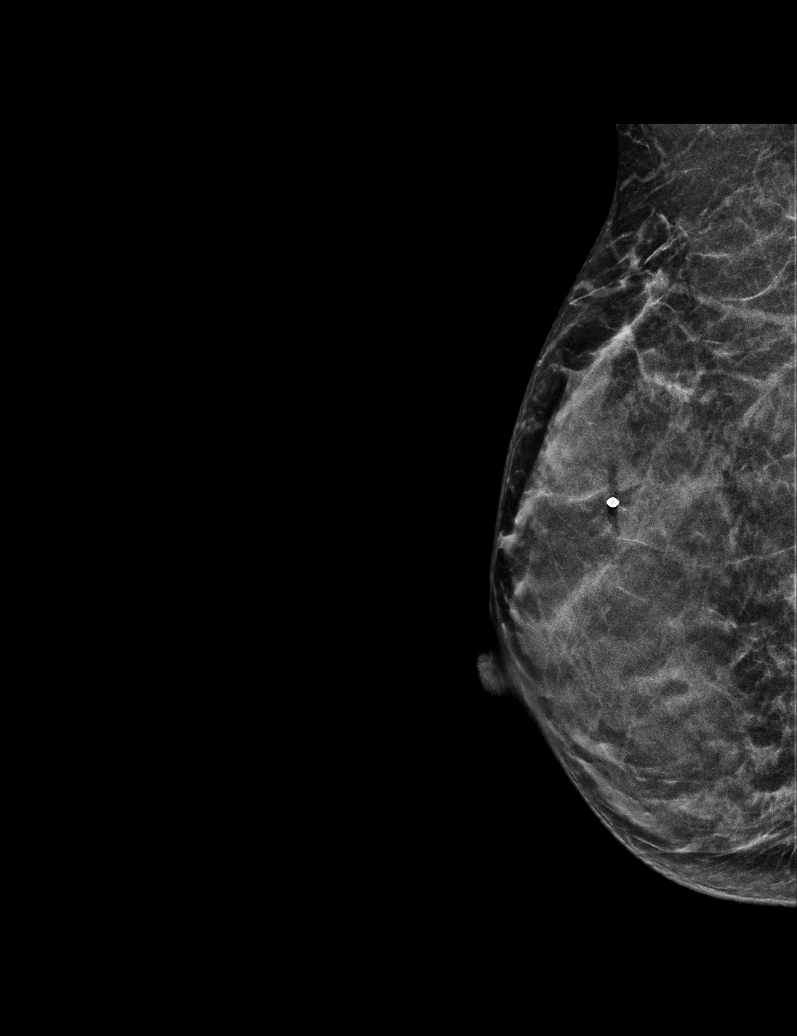

[L CC synth-2D]
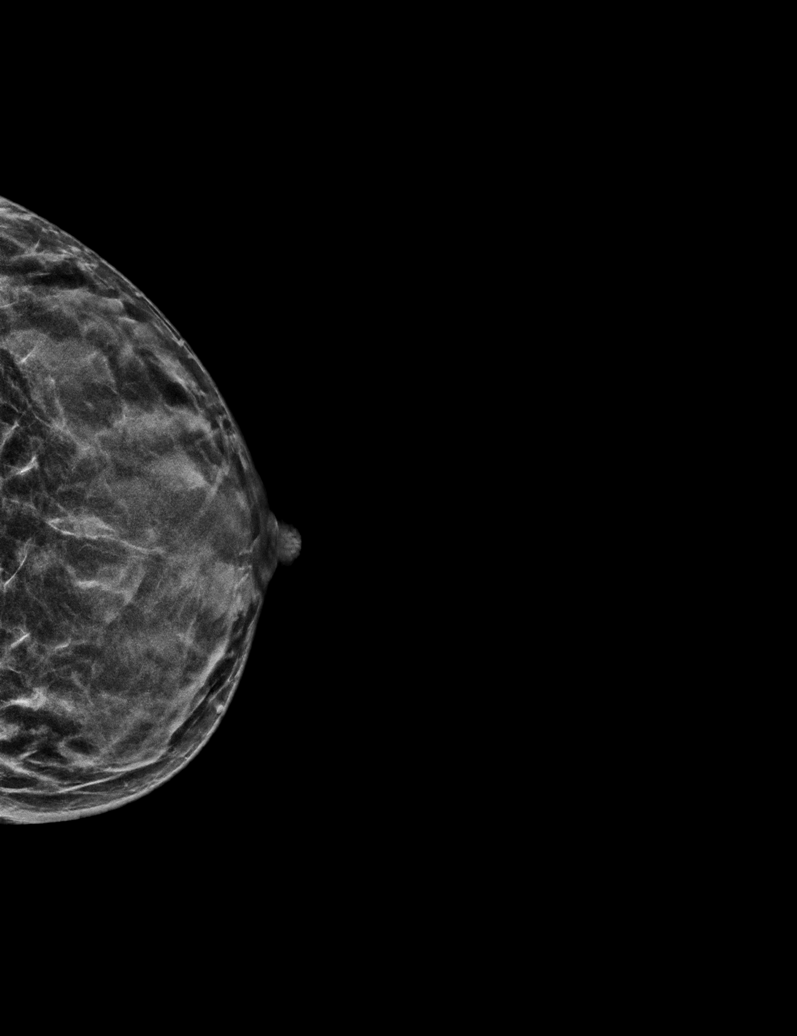

[R CC synth-2D (1 of 2)]
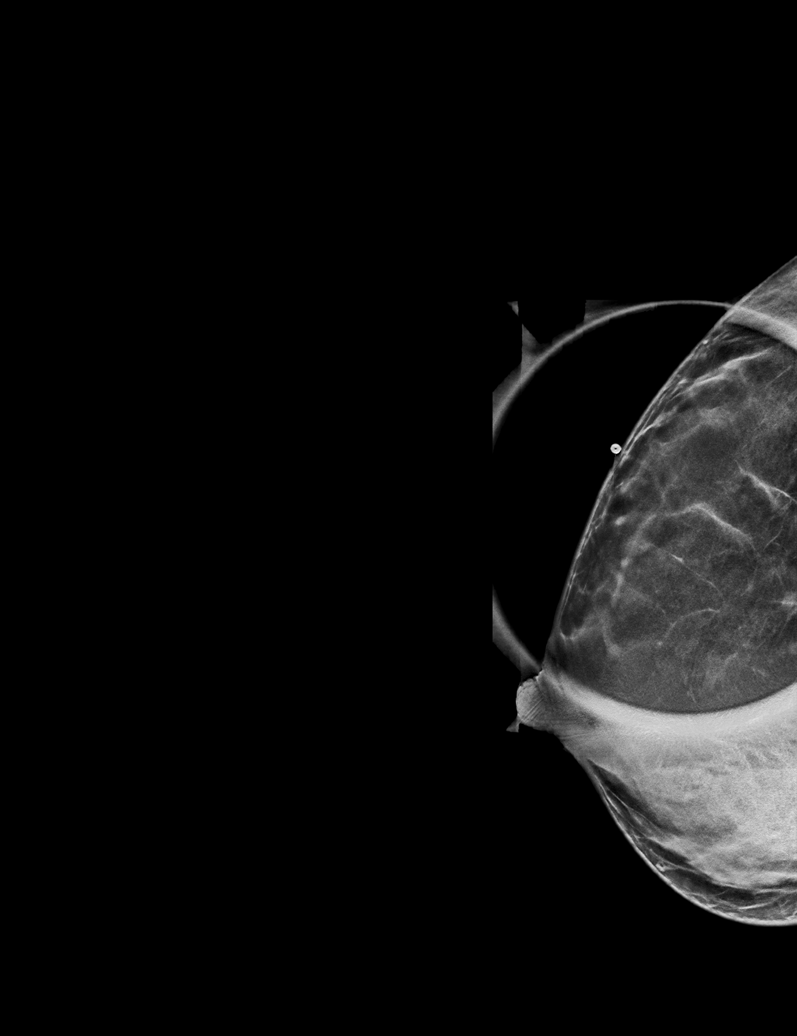

[L MLO synth-2D]
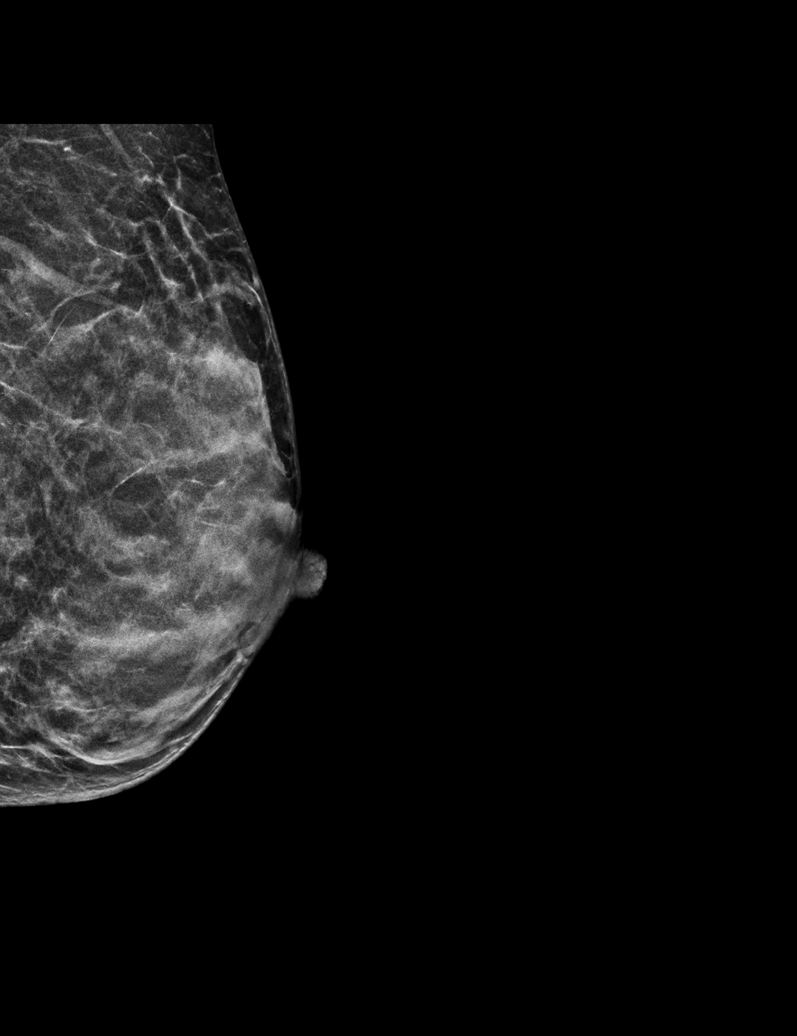

[R CC synth-2D (2 of 2)]
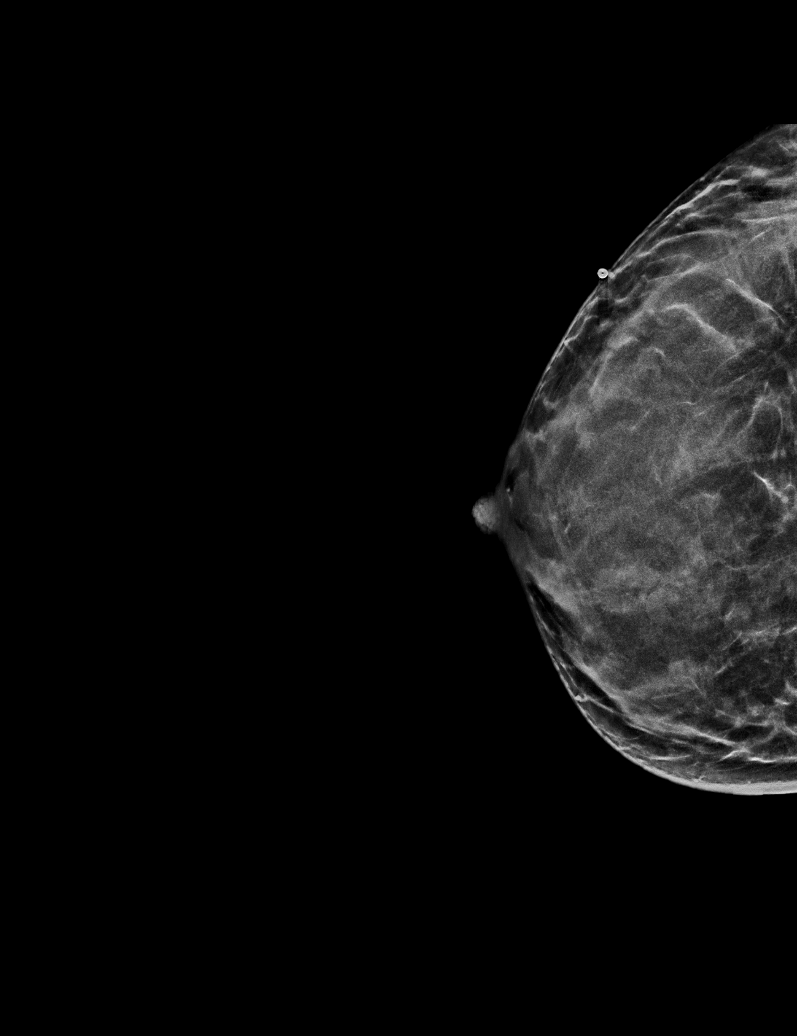

[L CC tomo · tomo slice 22/43.0]
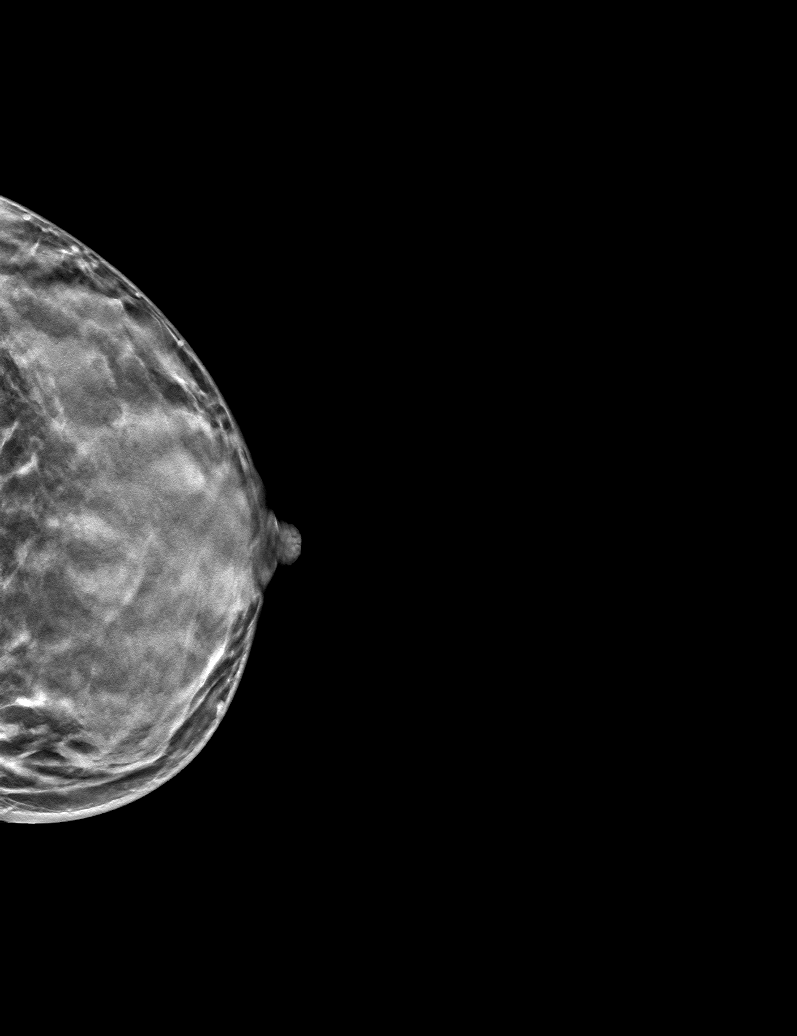

[6 of 30 positions shown; findings below may reference images not displayed]

ACR Breast Density Category d: The breast tissue is extremely dense,
which lowers the sensitivity of mammography.
FINDINGS: A radiopaque BB was placed at the site of the patient's focal pain
in the upper outer right breast. No focal findings are seen deep to
the radiopaque BB. No suspicious findings are identified in either
breast.

Mammographic images were processed with CAD.

Targeted ultrasound is performed, showing dense fibroglandular
tissue without focal or suspicious sonographic abnormality.
Evaluation of the upper outer right breast was performed.
IMPRESSION: 1. No mammographic evidence of malignancy in either breast.
2. No suspicious sonographic findings at the site of the patient's
right breast pain. Recommendation is for clinical and symptomatic
follow-up.

RECOMMENDATION:
1. Clinical follow-up recommended for the painful area of concern in
the right breast. Any further workup should be based on clinical
grounds.
2. Screening mammogram at age 40 unless there are persistent or
intervening clinical concerns. (Code:[id number])

I have discussed the findings and recommendations with the patient.
Results were also provided in writing at the conclusion of the
visit. If applicable, a reminder letter will be sent to the patient
regarding the next appointment.

BI-RADS CATEGORY  1: Negative.

## 2020-09-23 ENCOUNTER — Other Ambulatory Visit: Payer: Self-pay

## 2020-09-23 ENCOUNTER — Ambulatory Visit (INDEPENDENT_AMBULATORY_CARE_PROVIDER_SITE_OTHER): Payer: No Typology Code available for payment source | Admitting: Cardiovascular Disease

## 2020-09-23 ENCOUNTER — Encounter: Payer: Self-pay | Admitting: Cardiovascular Disease

## 2020-09-23 DIAGNOSIS — R0789 Other chest pain: Secondary | ICD-10-CM | POA: Diagnosis not present

## 2020-09-23 DIAGNOSIS — I10 Essential (primary) hypertension: Secondary | ICD-10-CM

## 2020-09-23 NOTE — Progress Notes (Signed)
09/23/2020 Rachael Sherman   December 09, 1986  [id number]  Primary Physician Arvilla Market, DO Primary Cardiologist: Runell Gess MD Nicholes Calamity, [place]  HPI:  Rachael Sherman is a 34 y.o.  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 Bertram Denver for cardiovascular valuation because of atypical chest pain hypertension.   I last saw her in the office 02/20/2020.  She really has no risk factors. She is adopted so does not know family history. She has had atypical chest pain and hypertension for a month. She was recently started on amlodipine. Originally the chest pain was constant notes episodic lasting for hours at a time. Her blood pressures been under better control on amlodipine 5 mg a day.  She has had renal Doppler studies that were performed that showed no evidence of renal artery stenosis.  Her blood pressures measured at home by herself were in the 120-130 range over 80-90, significantly improved since prior to starting amlodipine.    She had a coronary calcium score performed 03/17/2020 which which was 0 suggesting no evidence of CAD.  Since I saw her several months ago her blood pressures have been much better controlled at home on minimal medications.  Her chest pain has resolved.  Current Meds  Medication Sig  . amLODipine (NORVASC) 5 MG tablet Take 1 tablet (5 mg total) by mouth daily.  . hydrOXYzine (ATARAX/VISTARIL) 25 MG tablet Take 1 tablet (25 mg total) by mouth 3 (three) times daily as needed.  Marland Kitchen ibuprofen (ADVIL) 800 MG tablet Take 800 mg by mouth 3 (three) times daily.  . ondansetron (ZOFRAN-ODT) 8 MG disintegrating tablet Take 1 tablet (8 mg total) by mouth every 8 (eight) hours as needed for nausea.  . traZODone (DESYREL) 100 MG tablet Take 1 tablet (100 mg total) by mouth at bedtime.  Marland Kitchen Ubrogepant (UBRELVY) 50 MG TABS Take 1 tablet by mouth daily as  needed. Take 1 tablet at the onset of migraine. Can repeat in 2 hours if still having headache. Do not exceed 200 mg in one day.     No Known Allergies  Social History   Socioeconomic History  . Marital status: Single    Spouse name: Not on file  . Number of children: Not on file  . Years of education: Not on file  . Highest education level: Not on file  Occupational History  . Not on file  Tobacco Use  . Smoking status: Never Smoker  . Smokeless tobacco: Never Used  Vaping Use  . Vaping Use: Never used  Substance and Sexual Activity  . Alcohol use: Yes    Comment: socially  . Drug use: No  . Sexual activity: Not on file  Other Topics Concern  . Not on file  Social History Narrative  . Not on file   Social Determinants of Health   Financial Resource Strain:   . Difficulty of Paying Living Expenses: Not on file  Food Insecurity:   . Worried About Programme researcher, broadcasting/film/video in the Last Year: Not on file  . Ran Out of Food in the Last Year: Not on file  Transportation Needs:   . Lack of Transportation (Medical): Not on file  . Lack of Transportation (Non-Medical): Not on file  Physical Activity:   . Days of Exercise per Week: Not on file  . Minutes of Exercise per Session: Not on file  Stress:   . Feeling of Stress : Not on file  Social Connections:   . Frequency of Communication with Friends and Family: Not on file  . Frequency of Social Gatherings with Friends and Family: Not on file  . Attends Religious Services: Not on file  . Active Member of Clubs or Organizations: Not on file  . Attends Banker Meetings: Not on file  . Marital Status: Not on file  Intimate Partner Violence:   . Fear of Current or Ex-Partner: Not on file  . Emotionally Abused: Not on file  . Physically Abused: Not on file  . Sexually Abused: Not on file     Review of Systems: General: negative for chills, fever, night sweats or weight changes.  Cardiovascular: negative for chest  pain, dyspnea on exertion, edema, orthopnea, palpitations, paroxysmal nocturnal dyspnea or shortness of breath Dermatological: negative for rash Respiratory: negative for cough or wheezing Urologic: negative for hematuria Abdominal: negative for nausea, vomiting, diarrhea, bright red blood per rectum, melena, or hematemesis Neurologic: negative for visual changes, syncope, or dizziness All other systems reviewed and are otherwise negative except as noted above.    Blood pressure 112/72, pulse 80, height 5\' 2"  (1.575 m), weight 102 lb 3.2 oz (46.4 kg), last menstrual period 09/10/2020, SpO2 99 %.  General appearance: alert and no distress Neck: no adenopathy, no carotid bruit, no JVD, supple, symmetrical, trachea midline and thyroid not enlarged, symmetric, no tenderness/mass/nodules Lungs: clear to auscultation bilaterally Heart: regular rate and rhythm, S1, S2 normal, no murmur, click, rub or gallop Extremities: extremities normal, atraumatic, no cyanosis or edema Pulses: 2+ and symmetric Skin: Skin color, texture, turgor normal. No rashes or lesions Neurologic: Alert and oriented X 3, normal strength and tone. Normal symmetric reflexes. Normal coordination and gait  EKG not performed today  ASSESSMENT AND PLAN:   Essential hypertension History of essential hypertension with much better blood pressure control on amlodipine 5 mg a day.  Blood pressure today is 112/72 at home shows similar readings with excellent control.  Atypical chest pain History of atypical chest pain in the past with a coronary calcium score of 0 performed 03/17/2020.  She has had no further symptoms of chest pain.      03/19/2020 MD FACP,FACC,FAHA, [facility] 09/23/2020 2:46 PM

## 2020-09-23 NOTE — Assessment & Plan Note (Signed)
History of atypical chest pain in the past with a coronary calcium score of 0 performed 03/17/2020.  She has had no further symptoms of chest pain.

## 2020-09-23 NOTE — Patient Instructions (Signed)
Medication Instructions:  The current medical regimen is effective;  continue present plan and medications.  *If you need a refill on your cardiac medications before your next appointment, please call your pharmacy*    Follow-Up: At [facility], you and your health needs are our priority.  As part of our continuing mission to provide you with exceptional heart care, we have created designated Provider Care Teams.  These Care Teams include your primary Cardiologist (physician) and Advanced Practice Providers (APPs -  Physician Assistants and Nurse Practitioners) who all work together to provide you with the care you need, when you need it.  We recommend signing up for the patient portal called "MyChart".  Sign up information is provided on this After Visit Summary.  MyChart is used to connect with patients for Virtual Visits (Telemedicine).  Patients are able to view lab/test results, encounter notes, upcoming appointments, etc.  Non-urgent messages can be sent to your provider as well.   To learn more about what you can do with MyChart, go to [email or web address].    Your next appointment:   As needed  The format for your next appointment:   In Person  Provider:   Jonathan Berry, MD     

## 2020-09-23 NOTE — Assessment & Plan Note (Signed)
History of essential hypertension with much better blood pressure control on amlodipine 5 mg a day.  Blood pressure today is 112/72 at home shows similar readings with excellent control.

## 2020-10-06 MED FILL — AMLODIPINE BESYLATE 5 MG TA: 5 | 30 days supply | Qty: 30 | Fill #1

## 2020-10-15 ENCOUNTER — Telehealth: Payer: No Typology Code available for payment source | Admitting: Nurse Practitioner

## 2020-10-15 DIAGNOSIS — R300 Dysuria: Secondary | ICD-10-CM

## 2020-10-15 DIAGNOSIS — M545 Low back pain, unspecified: Secondary | ICD-10-CM

## 2020-10-15 NOTE — Progress Notes (Signed)
Based on what you shared with me it looks like you have urinary symptoms with associated back pain,that should be evaluated in a face to face office visit. Due to back pain you will need a urinalysis and urine ulture for proper treatment.    NOTE: If you entered your credit card information for this eVisit, you will not be charged. You may see a "hold" on your card for the $35 but that hold will drop off and you will not have a charge processed.  If you are having a true medical emergency please call 911.     For an urgent face to face visit, [facility] has four urgent care centers for your convenience:   . [facility]    [phone]                  Get Driving Directions  [address] [place], [place] [address] . 10 am to 8 pm Monday-Friday . 12 pm to 8 pm Saturday-Sunday   . [facility] at [facility]  [phone]                  Get Driving Directions  [address], [address] [place], [place] [address] . 8 am to 8 pm Monday-Friday . 9 am to 6 pm Saturday . 11 am to 6 pm Sunday   . [facility] at [facility]  [phone]                  Get Driving Directions   [address] [place], [place] [address] . 8 am to 8 pm Monday-Friday . 8 am to 4 pm Saturday-Sunday    . [facility] at [facility] Directions  [phone]  [address], [address] [place], [place] [address]  . Monday-Friday, 12 PM to 6 PM    Your e-visit answers were reviewed by a board certified advanced clinical practitioner to complete your personal care plan.  Thank you for using e-Visits.

## 2020-10-24 ENCOUNTER — Ambulatory Visit (HOSPITAL_COMMUNITY)
Admission: EM | Admit: 2020-10-24 | Discharge: 2020-10-24 | Disposition: A | Discharge status: Home / Self Care | Payer: No Typology Code available for payment source | Attending: Family Medicine | Admitting: Family Medicine

## 2020-10-24 ENCOUNTER — Other Ambulatory Visit: Payer: Self-pay

## 2020-10-24 ENCOUNTER — Encounter (HOSPITAL_COMMUNITY): Payer: Self-pay

## 2020-10-24 ENCOUNTER — Other Ambulatory Visit (HOSPITAL_COMMUNITY): Payer: Self-pay | Admitting: Family Medicine

## 2020-10-24 DIAGNOSIS — R109 Unspecified abdominal pain: Secondary | ICD-10-CM | POA: Diagnosis not present

## 2020-10-24 DIAGNOSIS — G43809 Other migraine, not intractable, without status migrainosus: Secondary | ICD-10-CM

## 2020-10-24 LAB — POCT URINALYSIS DIPSTICK, ED / UC
Bilirubin Urine: NEGATIVE
Glucose, UA: NEGATIVE mg/dL
Hgb urine dipstick: NEGATIVE
Ketones, ur: NEGATIVE mg/dL
Leukocytes,Ua: NEGATIVE
Nitrite: NEGATIVE
Protein, ur: NEGATIVE mg/dL
Specific Gravity, Urine: 1.025 (ref 1.005–1.030)
Urobilinogen, UA: 1 mg/dL (ref 0.0–1.0)
pH: 6 (ref 5.0–8.0)

## 2020-10-24 MED ORDER — CYCLOBENZAPRINE HCL 5 MG PO TABS: 5.0000 mg | ORAL_TABLET | Freq: Three times a day (TID) | ORAL | 0 refills | Status: DC | PRN

## 2020-10-24 MED ORDER — ONDANSETRON 8 MG PO TBDP: 8.0000 mg | ORAL_TABLET | Freq: Three times a day (TID) | ORAL | 0 refills | Status: DC | PRN

## 2020-10-24 MED FILL — CYCLOBENZAPRINE HCL 5 MG TA: 5 | 10 days supply | Qty: 30 | Fill #0

## 2020-10-24 MED FILL — ONDANSETRON ODT 8 MG TABLET: 8 | 10 days supply | Qty: 30 | Fill #0

## 2020-10-24 NOTE — ED Triage Notes (Signed)
Pt presents with complaints of bilateral flank pain, nausea and migraine x 1 week. Patient just finished a course of Keflex 2 days ago. Denies dysuria, endorses mild lower abdominal pain. Reports a pressure with urination.

## 2020-10-24 NOTE — Discharge Instructions (Signed)
Medication as prescribed. Your urine did not show any infection.   Rest, heat Ibuprofen and Tylenol as needed. Follow up as needed for continued or worsening symptoms

## 2020-10-27 NOTE — ED Provider Notes (Signed)
[facility]    CSN: [id number] Arrival date & time: 10/24/20  3016      History   Chief Complaint Chief Complaint  Patient presents with  . Flank Pain    HPI Rachael Sherman is a 34 y.o. female.   Patient is a 34 year old female presents today with bilateral flank pain, nausea.  Was seen by E-visit prior to today and treated for urinary tract infection with Keflex.  She just finished this 2 days ago.  Concerned due to having flank pain concern for kidney infection.  Denies any dysuria.  Reports some mild abdominal pain.  Denies any concern for STDs at this time. Patient's last menstrual period was 10/07/2020.  Does have history of kidney stones      Past Medical History:  Diagnosis Date  . Endometriosis     Patient Active Problem List   Diagnosis Date Noted  . Essential hypertension 01/31/2020  . Atypical chest pain 01/31/2020    Past Surgical History:  Procedure Laterality Date  . BREAST EXCISIONAL BIOPSY Left    fibroadenoma  . BREAST SURGERY     tumor (benign) left breast removal   . CESAREAN SECTION      OB History   No obstetric history on file.      Home Medications    Prior to Admission medications   Medication Sig Start Date End Date Taking? Authorizing Provider  amLODipine (NORVASC) 5 MG tablet Take 1 tablet (5 mg total) by mouth daily. 01/01/20  Yes Rayen Dafoe A, NP  hydrOXYzine (ATARAX/VISTARIL) 25 MG tablet Take 1 tablet (25 mg total) by mouth 3 (three) times daily as needed. 08/08/19  Yes Claiborne Rigg, NP  traZODone (DESYREL) 100 MG tablet Take 1 tablet (100 mg total) by mouth at bedtime. 07/18/19  Yes Claiborne Rigg, NP  Ubrogepant (UBRELVY) 50 MG TABS Take 1 tablet by mouth daily as needed. Take 1 tablet at the onset of migraine. Can repeat in 2 hours if still having headache. Do not exceed 200 mg in one day. 06/16/20  Yes Arvilla Market, DO  cyclobenzaprine (FLEXERIL) 5 MG tablet Take 1 tablet (5 mg total) by mouth 3  (three) times daily as needed for muscle spasms. 10/24/20   Dahlia Byes A, NP  ibuprofen (ADVIL) 800 MG tablet Take 800 mg by mouth 3 (three) times daily. 12/25/19   [provider]  ondansetron (ZOFRAN-ODT) 8 MG disintegrating tablet Take 1 tablet (8 mg total) by mouth every 8 (eight) hours as needed for nausea. 10/24/20   Janace Aris, NP    Family History Family History  Problem Relation Age of Onset  . Healthy Mother   . Healthy Father     Social History Social History   Tobacco Use  . Smoking status: Never Smoker  . Smokeless tobacco: Never Used  Vaping Use  . Vaping Use: Never used  Substance Use Topics  . Alcohol use: Yes    Comment: socially  . Drug use: No     Allergies   Patient has no known allergies.   Review of Systems Review of Systems   Physical Exam Triage Vital Signs ED Triage Vitals  Enc Vitals Group     BP 10/24/20 1005 (!) 137/97     Pulse Rate 10/24/20 1005 77     Resp 10/24/20 1005 19     Temp 10/24/20 1005 98.5 F (36.9 C)     Temp src --      SpO2  10/24/20 1005 99 %     Weight --      Height --      Head Circumference --      Peak Flow --      Pain Score 10/24/20 1003 7     Pain Loc --      Pain Edu? --      Excl. in GC? --    No data found.  Updated Vital Signs BP (!) 137/97   Pulse 77   Temp 98.5 F (36.9 C)   Resp 19   LMP 10/07/2020   SpO2 99%   Visual Acuity Right Eye Distance:   Left Eye Distance:   Bilateral Distance:    Right Eye Near:   Left Eye Near:    Bilateral Near:     Physical Exam Vitals and nursing note reviewed.  Constitutional:      General: She is not in acute distress.    Appearance: Normal appearance. She is not ill-appearing, toxic-appearing or diaphoretic.  HENT:     Head: Normocephalic.     Nose: Nose normal.  Eyes:     Conjunctiva/sclera: Conjunctivae normal.  Pulmonary:     Effort: Pulmonary effort is normal.  Musculoskeletal:        General: Normal range of motion.      Cervical back: Normal range of motion.  Skin:    General: Skin is warm and dry.     Findings: No rash.  Neurological:     Mental Status: She is alert.  Psychiatric:        Mood and Affect: Mood normal.      UC Treatments / Results  Labs (all labs ordered are listed, but only abnormal results are displayed) Labs Reviewed  POCT URINALYSIS DIPSTICK, ED / UC    EKG   Radiology No results found.  Procedures Procedures (including critical care time)  Medications Ordered in UC Medications - No data to display  Initial Impression / Assessment and Plan / UC Course  I have reviewed the triage vital signs and the nursing notes.  Pertinent labs & imaging results that were available during my care of the patient were reviewed by me and considered in my medical decision making (see chart for details).     Flank pain Urine without any trace of infection today. No concern for pyelonephritis Pain most likely musculoskeletal versus lingering pain from a kidney stone that passed earlier in the week. Refilled Zofran to use as needed for nausea, vomiting.  Flexeril for muscle relaxant. Rest, heat, ibuprofen Tylenol as needed. Follow up as needed for continued or worsening symptoms  Final Clinical Impressions(s) / UC Diagnoses   Final diagnoses:  Flank pain     Discharge Instructions     Medication as prescribed. Your urine did not show any infection.   Rest, heat Ibuprofen and Tylenol as needed. Follow up as needed for continued or worsening symptoms     ED Prescriptions    Medication Sig Dispense Auth. Provider   ondansetron (ZOFRAN-ODT) 8 MG disintegrating tablet Take 1 tablet (8 mg total) by mouth every 8 (eight) hours as needed for nausea. 30 tablet Morene Cecilio A, NP   cyclobenzaprine (FLEXERIL) 5 MG tablet Take 1 tablet (5 mg total) by mouth 3 (three) times daily as needed for muscle spasms. 30 tablet Dahlia Byes A, NP     PDMP not reviewed this encounter.     Janace Aris, NP 10/27/20 0945

## 2020-11-07 ENCOUNTER — Other Ambulatory Visit: Payer: Self-pay | Admitting: Physician Assistant

## 2020-11-07 ENCOUNTER — Telehealth: Payer: No Typology Code available for payment source | Admitting: Physician Assistant

## 2020-11-07 DIAGNOSIS — K0889 Other specified disorders of teeth and supporting structures: Secondary | ICD-10-CM

## 2020-11-07 MED ORDER — NAPROXEN 500 MG PO TABS: 500.0000 mg | ORAL_TABLET | Freq: Two times a day (BID) | ORAL | 0 refills | Status: DC

## 2020-11-07 MED ORDER — AMOXICILLIN 500 MG PO CAPS: 500.0000 mg | ORAL_CAPSULE | Freq: Three times a day (TID) | ORAL | 0 refills | Status: DC

## 2020-11-07 MED FILL — AMOXICILLIN 500 MG CAPSULE: 500 | 10 days supply | Qty: 30 | Fill #0

## 2020-11-07 MED FILL — NAPROXEN 500 MG TABLET: 500 | 7 days supply | Qty: 14 | Fill #0

## 2020-11-07 NOTE — Progress Notes (Signed)
E-Visit for Dental Pain  We are sorry that you are not feeling well.  Here is how we plan to help!  Based on what you have shared with me in the questionnaire, it sounds like you have dental pain due to a possible dental infection.  naprosyn 500mg  2 times per day for 7 days for discomfort and Amoxicillin 500mg  3 times per day for 10 days  It is imperative that you see a dentist within 10 days of this eVisit to determine the cause of the dental pain and be sure it is adequately treated  A toothache or tooth pain is caused when the nerve in the root of a tooth or surrounding a tooth is irritated. Dental (tooth) infection, decay, injury, or loss of a tooth are the most common causes of dental pain. Pain may also occur after an extraction (tooth is pulled out). Pain sometimes originates from other areas and radiates to the jaw, thus appearing to be tooth pain.Bacteria growing inside your mouth can contribute to gum disease and dental decay, both of which can cause pain. A toothache occurs from inflammation of the central portion of the tooth called pulp. The pulp contains nerve endings that are very sensitive to pain. Inflammation to the pulp or pulpitis may be caused by dental cavities, trauma, and infection.    HOME CARE:   For toothaches: . Over-the-counter pain medications such as acetaminophen or ibuprofen may be used. Take these as directed on the package while you arrange for a dental appointment. . Avoid very cold or hot foods, because they may make the pain worse. . You may get relief from biting on a cotton ball soaked in oil of cloves. You can get oil of cloves at most drug stores.  For jaw pain: .  Aspirin may be helpful for problems in the joint of the jaw in adults. . If pain happens every time you open your mouth widely, the temporomandibular joint (TMJ) may be the source of the pain. Yawning or taking a large bite of food may worsen the pain. An appointment with your doctor or  dentist will help you find the cause.     GET HELP RIGHT AWAY IF:  . You have a high fever or chills . If you have had a recent head or face injury and develop headache, light headedness, nausea, vomiting, or other symptoms that concern you after an injury to your face or mouth, you could have a more serious injury in addition to your dental injury. . A facial rash associated with a toothache: This condition may improve with medication. Contact your doctor for them to decide what is appropriate. . Any jaw pain occurring with chest pain: Although jaw pain is most commonly caused by dental disease, it is sometimes referred pain from other areas. People with heart disease, especially people who have had stents placed, people with diabetes, or those who have had heart surgery may have jaw pain as a symptom of heart attack or angina. If your jaw or tooth pain is associated with lightheadedness, sweating, or shortness of breath, you should see a doctor as soon as possible. . Trouble swallowing or excessive pain or bleeding from gums: If you have a history of a weakened immune system, diabetes, or steroid use, you may be more susceptible to infections. Infections can often be more severe and extensive or caused by unusual organisms. Dental and gum infections in people with these conditions may require more aggressive treatment. An abscess may  need draining or IV antibiotics, for example.  MAKE SURE YOU    Understand these instructions.  Will watch your condition.  Will get help right away if you are not doing well or get worse.  Thank you for choosing an e-visit. Your e-visit answers were reviewed by a board certified advanced clinical practitioner to complete your personal care plan. Depending upon the condition, your plan could have included both over the counter or prescription medications. Please review your pharmacy choice. Make sure the pharmacy is open so you can pick up prescription now. If  there is a problem, you may contact your provider through [employer] and have the prescription routed to another pharmacy. Your safety is important to [place]. If you have drug allergies check your prescription carefully.  For the next 24 hours you can use MyChart to ask questions about today's visit, request a non-urgent call back, or ask for a work or school excuse. You will get an email in the next two days asking about your experience. I hope that your e-visit has been valuable and will speed your recovery.  Approximately 5 minutes was spent documenting and reviewing patient's chart.

## 2020-11-21 ENCOUNTER — Other Ambulatory Visit: Payer: Self-pay | Admitting: Orthopedic Surgery

## 2020-11-21 ENCOUNTER — Telehealth: Payer: No Typology Code available for payment source | Admitting: Orthopedic Surgery

## 2020-11-21 DIAGNOSIS — K0889 Other specified disorders of teeth and supporting structures: Secondary | ICD-10-CM

## 2020-11-21 MED ORDER — AMOXICILLIN 500 MG PO CAPS: 500.0000 mg | ORAL_CAPSULE | Freq: Three times a day (TID) | ORAL | 0 refills | Status: DC

## 2020-11-21 MED FILL — AMOXICILLIN 500 MG CAPSULE: 500 | 10 days supply | Qty: 30 | Fill #0

## 2020-11-21 NOTE — Progress Notes (Signed)
E-Visit for Dental Pain  We are sorry that you are not feeling well.  Here is how we plan to help!  Based on what you have shared with me in the questionnaire, it sounds like you have a cavity that became infected. The following medicine I prescribed below should help.  Amoxicillin 500mg  3 times per day for 10 days  It is imperative that you see a dentist within 10 days of this eVisit to determine the cause of the dental pain and be sure it is adequately treated.   A toothache or tooth pain is caused when the nerve in the root of a tooth or surrounding a tooth is irritated. Dental (tooth) infection, decay, injury, or loss of a tooth are the most common causes of dental pain. Pain may also occur after an extraction (tooth is pulled out). Pain sometimes originates from other areas and radiates to the jaw, thus appearing to be tooth pain.Bacteria growing inside your mouth can contribute to gum disease and dental decay, both of which can cause pain. A toothache occurs from inflammation of the central portion of the tooth called pulp. The pulp contains nerve endings that are very sensitive to pain. Inflammation to the pulp or pulpitis may be caused by dental cavities, trauma, and infection.    HOME CARE:   For toothaches:  Over-the-counter pain medications such as acetaminophen or ibuprofen may be used. Take these as directed on the package while you arrange for a dental appointment.  Avoid very cold or hot foods, because they may make the pain worse.  You may get relief from biting on a cotton ball soaked in oil of cloves. You can get oil of cloves at most drug stores.  For jaw pain:   Aspirin may be helpful for problems in the joint of the jaw in adults.  If pain happens every time you open your mouth widely, the temporomandibular joint (TMJ) may be the source of the pain. Yawning or taking a large bite of food may worsen the pain. An appointment with your doctor or dentist will help you find  the cause.     GET HELP RIGHT AWAY IF:   You have a high fever or chills  If you have had a recent head or face injury and develop headache, light headedness, nausea, vomiting, or other symptoms that concern you after an injury to your face or mouth, you could have a more serious injury in addition to your dental injury.  A facial rash associated with a toothache: This condition may improve with medication. Contact your doctor for them to decide what is appropriate.  Any jaw pain occurring with chest pain: Although jaw pain is most commonly caused by dental disease, it is sometimes referred pain from other areas. People with heart disease, especially people who have had stents placed, people with diabetes, or those who have had heart surgery may have jaw pain as a symptom of heart attack or angina. If your jaw or tooth pain is associated with lightheadedness, sweating, or shortness of breath, you should see a doctor as soon as possible.  Trouble swallowing or excessive pain or bleeding from gums: If you have a history of a weakened immune system, diabetes, or steroid use, you may be more susceptible to infections. Infections can often be more severe and extensive or caused by unusual organisms. Dental and gum infections in people with these conditions may require more aggressive treatment. An abscess may need draining or IV antibiotics, for  example.  MAKE SURE YOU    Understand these instructions.  Will watch your condition.  Will get help right away if you are not doing well or get worse.  Thank you for choosing an e-visit. Your e-visit answers were reviewed by a board certified advanced clinical practitioner to complete your personal care plan. Depending upon the condition, your plan could have included both over the counter or prescription medications. Please review your pharmacy choice. Make sure the pharmacy is open so you can pick up prescription now. If there is a problem, you may  contact your provider through [employer] and have the prescription routed to another pharmacy. Your safety is important to [place]. If you have drug allergies check your prescription carefully.  For the next 24 hours you can use MyChart to ask questions about today's visit, request a non-urgent call back, or ask for a work or school excuse. You will get an email in the next two days asking about your experience. I hope that your e-visit has been valuable and will speed your recovery.  Greater than 5 minutes, yet less than 10 minutes of time have been spent researching, coordinating and implementing care for this patient today.

## 2020-11-24 ENCOUNTER — Other Ambulatory Visit (HOSPITAL_COMMUNITY): Payer: Self-pay | Admitting: Family Medicine

## 2020-11-24 ENCOUNTER — Telehealth (HOSPITAL_COMMUNITY): Payer: Self-pay | Admitting: Family Medicine

## 2020-11-24 MED ORDER — DOXYCYCLINE HYCLATE 100 MG PO CAPS: 100.0000 mg | ORAL_CAPSULE | Freq: Two times a day (BID) | ORAL | 0 refills | Status: DC

## 2020-11-24 MED FILL — DOXYCYCLINE HYCLATE 100 MG: 100 | 7 days supply | Qty: 14 | Fill #0

## 2020-11-24 NOTE — Telephone Encounter (Signed)
Treating abscess

## 2020-11-25 ENCOUNTER — Telehealth: Payer: No Typology Code available for payment source | Admitting: Internal Medicine

## 2020-11-25 ENCOUNTER — Encounter: Payer: Self-pay | Admitting: *Deleted

## 2020-11-25 ENCOUNTER — Telehealth (INDEPENDENT_AMBULATORY_CARE_PROVIDER_SITE_OTHER): Payer: No Typology Code available for payment source | Admitting: Physician Assistant

## 2020-11-25 ENCOUNTER — Other Ambulatory Visit: Payer: Self-pay | Admitting: Physician Assistant

## 2020-11-25 DIAGNOSIS — G43809 Other migraine, not intractable, without status migrainosus: Secondary | ICD-10-CM

## 2020-11-25 DIAGNOSIS — I10 Essential (primary) hypertension: Secondary | ICD-10-CM | POA: Diagnosis not present

## 2020-11-25 DIAGNOSIS — E559 Vitamin D deficiency, unspecified: Secondary | ICD-10-CM | POA: Diagnosis not present

## 2020-11-25 MED ORDER — UBRELVY 50 MG PO TABS: 1.0000 | ORAL_TABLET | Freq: Every day | ORAL | 0 refills | Status: DC | PRN

## 2020-11-25 MED ORDER — AMITRIPTYLINE HCL 10 MG PO TABS: 10.0000 mg | ORAL_TABLET | Freq: Every day | ORAL | 0 refills | Status: DC

## 2020-11-25 MED ORDER — AMLODIPINE BESYLATE 5 MG PO TABS: 5.0000 mg | ORAL_TABLET | Freq: Every day | ORAL | 0 refills | Status: DC

## 2020-11-25 MED ORDER — ONDANSETRON HCL 8 MG PO TABS: 8.0000 mg | ORAL_TABLET | Freq: Three times a day (TID) | ORAL | 0 refills | Status: DC | PRN

## 2020-11-25 MED FILL — AMLODIPINE BESYLATE 5 MG TA: 5 | 30 days supply | Qty: 30 | Fill #0

## 2020-11-25 MED FILL — ONDANSETRON HCL 8 MG TABLET: 8 | 7 days supply | Qty: 20 | Fill #0

## 2020-11-25 MED FILL — UBRELVY 50 MG TABS: 50 | 5 days supply | Qty: 10 | Fill #0

## 2020-11-25 MED FILL — AMITRIPTYLINE HCL 10 MG TAB: 10 | 30 days supply | Qty: 30 | Fill #0

## 2020-11-25 NOTE — Progress Notes (Signed)
Patient verified DOB Patient denies pain at this time. Patient last HA reports this past Sunday. Patient reports Migraine occurring more frequently and intensifying. Patient reports medication assisting intermittently.

## 2020-11-25 NOTE — Progress Notes (Signed)
Established Patient Office Visit  Subjective:  Patient ID: Rachael Sherman, female    DOB: [birth year]  Age: 34 y.o. MRN: [id number]  CC:  Chief Complaint  Patient presents with  . Migraine   Virtual Visit via Telephone Note  I connected with Rachael Sherman on 11/26/20 at  2:10 PM EST by telephone and verified that I am speaking with the correct person using two identifiers.  Location: Patient: Home Provider: [facility]   I discussed the limitations, risks, security and privacy concerns of performing an evaluation and management service by telephone and the availability of in person appointments. I also discussed with the patient that there may be a patient responsible charge related to this service. The patient expressed understanding and agreed to proceed.   History of Present Illness:  Reports that she has been continuing to have an increased number of migraines, states that she is having them 5 to 6 days out of the week, Thanksgiving night she had a severe migraine with nausea, photosensitivity.  Reports that she has been using the Ubrelvy with a little relief, the Zofran with some relief.  Reports that she has been told by cardiology that she only needs to use her blood pressure medication as needed, states that she checks her blood pressure on a daily basis, states it is very well controlled.  Reports that she uses the hydroxyzine and trazodone as needed, approximately once a week.    Observations/Objective:  Medical history and current medications reviewed, no physical exam completed       Past Medical History:  Diagnosis Date  . Endometriosis   . Hypertension    [employer] 11/25/2020    Past Surgical History:  Procedure Laterality Date  . BREAST EXCISIONAL BIOPSY Left    fibroadenoma  . BREAST SURGERY     tumor (benign) left breast removal   . CESAREAN SECTION    . TUBAL LIGATION N/A    [employer] 11/25/2020    Family History  Problem Relation Age  of Onset  . Healthy Mother   . Healthy Father     Social History   Socioeconomic History  . Marital status: Single    Spouse name: Not on file  . Number of children: Not on file  . Years of education: Not on file  . Highest education level: Not on file  Occupational History  . Not on file  Tobacco Use  . Smoking status: Never Smoker  . Smokeless tobacco: Never Used  Vaping Use  . Vaping Use: Never used  Substance and Sexual Activity  . Alcohol use: Yes    Comment: socially  . Drug use: No  . Sexual activity: Not Currently  Other Topics Concern  . Not on file  Social History Narrative  . Not on file   Social Determinants of Health   Financial Resource Strain:   . Difficulty of Paying Living Expenses: Not on file  Food Insecurity:   . Worried About Programme researcher, broadcasting/film/video in the Last Year: Not on file  . Ran Out of Food in the Last Year: Not on file  Transportation Needs:   . Lack of Transportation (Medical): Not on file  . Lack of Transportation (Non-Medical): Not on file  Physical Activity:   . Days of Exercise per Week: Not on file  . Minutes of Exercise per Session: Not on file  Stress:   . Feeling of Stress : Not on file  Social Connections:   . Frequency of  Communication with Friends and Family: Not on file  . Frequency of Social Gatherings with Friends and Family: Not on file  . Attends Religious Services: Not on file  . Active Member of Clubs or Organizations: Not on file  . Attends [employer] or Organization Meetings: Not on file  . Marital Status: Not on file  Intimate Partner Violence:   . Fear of Current or Ex-Partner: Not on file  . Emotionally Abused: Not on file  . Physically Abused: Not on file  . Sexually Abused: Not on file    Outpatient Medications Prior to Visit  Medication Sig Dispense Refill  . doxycycline (VIBRAMYCIN) 100 MG capsule Take 1 capsule (100 mg total) by mouth 2 (two) times daily for 7 days. 14 capsule 0  . hydrOXYzine  (ATARAX/VISTARIL) 25 MG tablet Take 1 tablet (25 mg total) by mouth 3 (three) times daily as needed. 60 tablet 1  . traZODone (DESYREL) 100 MG tablet Take 1 tablet (100 mg total) by mouth at bedtime. 30 tablet 1  . amLODipine (NORVASC) 5 MG tablet Take 1 tablet (5 mg total) by mouth daily. (Patient taking differently: Take 5 mg by mouth as needed. ) 30 tablet 0  . Ubrogepant (UBRELVY) 50 MG TABS Take 1 tablet by mouth daily as needed. Take 1 tablet at the onset of migraine. Can repeat in 2 hours if still having headache. Do not exceed 200 mg in one day. 10 tablet 0   No facility-administered medications prior to visit.    No Known Allergies  ROS Review of Systems  Constitutional: Positive for fatigue. Negative for chills and fever.  HENT: Negative.   Eyes: Positive for photophobia.  Respiratory: Negative.   Cardiovascular: Negative.   Gastrointestinal: Positive for nausea. Negative for vomiting.  Endocrine: Negative.   Genitourinary: Negative.   Musculoskeletal: Negative.   Skin: Negative.   Allergic/Immunologic: Negative.   Neurological: Positive for headaches. Negative for speech difficulty and weakness.  Hematological: Negative.   Psychiatric/Behavioral: Negative.       Objective:     LMP 11/04/2020  Wt Readings from Last 3 Encounters:  09/23/20 102 lb 3.2 oz (46.4 kg)  05/23/20 110 lb (49.9 kg)  02/20/20 99 lb 9.6 oz (45.2 kg)     Health Maintenance Due  Topic Date Due  . Hepatitis C Screening  Never done  . HIV Screening  Never done  . TETANUS/TDAP  Never done  . INFLUENZA VACCINE  07/27/2020    There are no preventive care reminders to display for this patient.  Lab Results  Component Value Date   TSH 2.128 01/07/2020   Lab Results  Component Value Date   WBC 4.9 12/31/2019   HGB 12.1 12/31/2019   HCT 37.4 12/31/2019   MCV 84.2 12/31/2019   PLT 251 12/31/2019   Lab Results  Component Value Date   NA 140 12/31/2019   K 4.1 12/31/2019   CO2 28  12/31/2019   GLUCOSE 97 12/31/2019   BUN 11 12/31/2019   CREATININE 0.75 12/31/2019   BILITOT 0.8 12/31/2019   ALKPHOS 45 12/31/2019   AST 12 (L) 12/31/2019   ALT 12 12/31/2019   PROT 7.2 12/31/2019   ALBUMIN 3.7 12/31/2019   CALCIUM 9.2 12/31/2019   ANIONGAP 6 12/31/2019   Lab Results  Component Value Date   CHOL 191 01/07/2020   Lab Results  Component Value Date   HDL 83 01/07/2020   Lab Results  Component Value Date   LDLCALC 100 (H) 01/07/2020  Lab Results  Component Value Date   TRIG 38 01/07/2020   Lab Results  Component Value Date   CHOLHDL 2.3 01/07/2020   Lab Results  Component Value Date   HGBA1C 5.0 04/23/2019      Assessment & Plan:   Problem List Items Addressed This Visit      Cardiovascular and Mediastinum   Essential hypertension   Relevant Medications   amLODipine (NORVASC) 5 MG tablet    Other Visit Diagnoses    Other migraine without status migrainosus, not intractable    -  Primary   Relevant Medications   Ubrogepant (UBRELVY) 50 MG TABS   amLODipine (NORVASC) 5 MG tablet   ondansetron (ZOFRAN) 8 MG tablet   amitriptyline (ELAVIL) 10 MG tablet   Other Relevant Orders   CBC with Differential/Platelet   Comp. Metabolic Panel (12)   TSH   Vitamin D, 25-hydroxy     Assessment and Plan:   1. Other migraine without status migrainosus, not intractable Trial amitriptyline 10 mg, consider increase in 2 weeks.  Continue abortive medications as needed.  Consider neurology referral - Ubrogepant (UBRELVY) 50 MG TABS; Take 1 tablet by mouth daily as needed. Take 1 tablet at the onset of migraine. Can repeat in 2 hours if still having headache. Do not exceed 200 mg in one day.  Dispense: 10 tablet; Refill: 0 - ondansetron (ZOFRAN) 8 MG tablet; Take 1 tablet (8 mg total) by mouth every 8 (eight) hours as needed for nausea or vomiting.  Dispense: 20 tablet; Refill: 0 - amitriptyline (ELAVIL) 10 MG tablet; Take 1 tablet (10 mg total) by mouth at  bedtime.  Dispense: 30 tablet; Refill: 0 - CBC with Differential/Platelet; Future - Comp. Metabolic Panel (12); Future - TSH; Future - Vitamin D, 25-hydroxy; Future  2. Essential hypertension Refill requested, patient takes as needed - amLODipine (NORVASC) 5 MG tablet; Take 1 tablet (5 mg total) by mouth daily.  Dispense: 30 tablet; Refill: 0  Follow Up Instructions:    I discussed the assessment and treatment plan with the patient. The patient was provided an opportunity to ask questions and all were answered. The patient agreed with the plan and demonstrated an understanding of the instructions.   The patient was advised to call back or seek an in-person evaluation if the symptoms worsen or if the condition fails to improve as anticipated.  I provided 21 minutes of non-face-to-face time during this encounter.   Demaree Liberto S Mayers, PA-C    Meds ordered this encounter  Medications  . Ubrogepant (UBRELVY) 50 MG TABS    Sig: Take 1 tablet by mouth daily as needed. Take 1 tablet at the onset of migraine. Can repeat in 2 hours if still having headache. Do not exceed 200 mg in one day.    Dispense:  10 tablet    Refill:  0    Order Specific Question:   Supervising Provider    Answer:   Delford Field, PATRICK E [[id number]]  . amLODipine (NORVASC) 5 MG tablet    Sig: Take 1 tablet (5 mg total) by mouth daily.    Dispense:  30 tablet    Refill:  0    Order Specific Question:   Supervising Provider    Answer:   Delford Field, PATRICK E [[id number]]  . ondansetron (ZOFRAN) 8 MG tablet    Sig: Take 1 tablet (8 mg total) by mouth every 8 (eight) hours as needed for nausea or vomiting.    Dispense:  20 tablet  Refill:  0    Order Specific Question:   Supervising Provider    Answer:   Shan Levans E [[id number]]  . amitriptyline (ELAVIL) 10 MG tablet    Sig: Take 1 tablet (10 mg total) by mouth at bedtime.    Dispense:  30 tablet    Refill:  0    Order Specific Question:   Supervising Provider    Answer:    Storm Frisk [[id number]]    Follow-up: Return in about 2 weeks (around 12/09/2020).    Kasandra Knudsen Mayers, PA-C

## 2020-11-26 ENCOUNTER — Encounter: Payer: Self-pay | Admitting: Physician Assistant

## 2020-11-26 DIAGNOSIS — G43909 Migraine, unspecified, not intractable, without status migrainosus: Secondary | ICD-10-CM | POA: Insufficient documentation

## 2020-11-26 NOTE — Patient Instructions (Signed)
You will take amitriptyline 10 mg once daily at bedtime to help prevent headaches and migraines.  This medication needs to be titrated so we may have increase the dose in 2 weeks.  This medication may make you sleepy, I recommend holding your trazodone until you know the effect of the amitriptyline.  We will call you with your lab results  I hope that you feel better soon  Roney Jaffe, PA-C Physician Assistant [facility] [email or web address]  Amitriptyline tablets What is this medicine? AMITRIPTYLINE (a mee TRIP ti leen) is used to treat depression. This medicine may be used for other purposes; ask your health care provider or pharmacist if you have questions. COMMON BRAND NAME(S): Elavil, Vanatrip What should I tell my health care provider before I take this medicine? They need to know if you have any of these conditions:  an alcohol problem  asthma, difficulty breathing  bipolar disorder or schizophrenia  difficulty passing urine, prostate trouble  glaucoma  heart disease or previous heart attack  liver disease  over active thyroid  seizures  thoughts or plans of suicide, a previous suicide attempt, or family history of suicide attempt  an unusual or allergic reaction to amitriptyline, other medicines, foods, dyes, or preservatives  pregnant or trying to get pregnant  breast-feeding How should I use this medicine? Take this medicine by mouth with a drink of water. Follow the directions on the prescription label. You can take the tablets with or without food. Take your medicine at regular intervals. Do not take it more often than directed. Do not stop taking this medicine suddenly except upon the advice of your doctor. Stopping this medicine too quickly may cause serious side effects or your condition may worsen. A special MedGuide will be given to you by the pharmacist with each prescription and refill. Be sure  to read this information carefully each time. Talk to your pediatrician regarding the use of this medicine in children. Special care may be needed. Overdosage: If you think you have taken too much of this medicine contact a poison control center or emergency room at once. NOTE: This medicine is only for you. Do not share this medicine with others. What if I miss a dose? If you miss a dose, take it as soon as you can. If it is almost time for your next dose, take only that dose. Do not take double or extra doses. What may interact with this medicine? Do not take this medicine with any of the following medications:  arsenic trioxide  certain medicines used to regulate abnormal heartbeat or to treat other heart conditions  cisapride  droperidol  halofantrine  linezolid  MAOIs like Carbex, Eldepryl, Marplan, Nardil, and Parnate  methylene blue  other medicines for mental depression  phenothiazines like perphenazine, thioridazine and chlorpromazine  pimozide  probucol  procarbazine  sparfloxacin  St. John's Wort This medicine may also interact with the following medications:  atropine and related drugs like hyoscyamine, scopolamine, tolterodine and others  barbiturate medicines for inducing sleep or treating seizures, like phenobarbital  cimetidine  disulfiram  ethchlorvynol  thyroid hormones such as levothyroxine  ziprasidone This list may not describe all possible interactions. Give your health care provider a list of all the medicines, herbs, non-prescription drugs, or dietary supplements you use. Also tell them if you smoke, drink alcohol, or use illegal drugs. Some items may interact with your medicine. What should I watch for while using this medicine? Tell your doctor if your  symptoms do not get better or if they get worse. Visit your doctor or health care professional for regular checks on your progress. Because it may take several weeks to see the full  effects of this medicine, it is important to continue your treatment as prescribed by your doctor. Patients and their families should watch out for new or worsening thoughts of suicide or depression. Also watch out for sudden changes in feelings such as feeling anxious, agitated, panicky, irritable, hostile, aggressive, impulsive, severely restless, overly excited and hyperactive, or not being able to sleep. If this happens, especially at the beginning of treatment or after a change in dose, call your health care professional. Bonita Quin may get drowsy or dizzy. Do not drive, use machinery, or do anything that needs mental alertness until you know how this medicine affects you. Do not stand or sit up quickly, especially if you are an older patient. This reduces the risk of dizzy or fainting spells. Alcohol may interfere with the effect of this medicine. Avoid alcoholic drinks. Do not treat yourself for coughs, colds, or allergies without asking your doctor or health care professional for advice. Some ingredients can increase possible side effects. Your mouth may get dry. Chewing sugarless gum or sucking hard candy, and drinking plenty of water will help. Contact your doctor if the problem does not go away or is severe. This medicine may cause dry eyes and blurred vision. If you wear contact lenses you may feel some discomfort. Lubricating drops may help. See your eye doctor if the problem does not go away or is severe. This medicine can cause constipation. Try to have a bowel movement at least every 2 to 3 days. If you do not have a bowel movement for 3 days, call your doctor or health care professional. This medicine can make you more sensitive to the sun. Keep out of the sun. If you cannot avoid being in the sun, wear protective clothing and use sunscreen. Do not use sun lamps or tanning beds/booths. What side effects may I notice from receiving this medicine? Side effects that you should report to your doctor  or health care professional as soon as possible:  allergic reactions like skin rash, itching or hives, swelling of the face, lips, or tongue  anxious  breathing problems  changes in vision  confusion  elevated mood, decreased need for sleep, racing thoughts, impulsive behavior  eye pain  fast, irregular heartbeat  feeling faint or lightheaded, falls  feeling agitated, angry, or irritable  fever with increased sweating  hallucination, loss of contact with reality  seizures  stiff muscles  suicidal thoughts or other mood changes  tingling, pain, or numbness in the feet or hands  trouble passing urine or change in the amount of urine  trouble sleeping  unusually weak or tired  vomiting  yellowing of the eyes or skin Side effects that usually do not require medical attention (report to your doctor or health care professional if they continue or are bothersome):  change in sex drive or performance  change in appetite or weight  constipation  dizziness  dry mouth  nausea  tired  tremors  upset stomach This list may not describe all possible side effects. Call your doctor for medical advice about side effects. You may report side effects to FDA at 1-800-FDA-1088. Where should I keep my medicine? Keep out of the reach of children. Store at room temperature between 20 and 25 degrees C (68 and 77 degrees F). Throw  away any unused medicine after the expiration date. NOTE: This sheet is a summary. It may not cover all possible information. If you have questions about this medicine, talk to your doctor, pharmacist, or health care provider.  2020 Elsevier/Gold Standard (2018-12-05 13:04:32)

## 2020-12-05 ENCOUNTER — Ambulatory Visit (HOSPITAL_COMMUNITY)
Admission: EM | Admit: 2020-12-05 | Discharge: 2020-12-05 | Disposition: A | Discharge status: Home / Self Care | Payer: No Typology Code available for payment source | Attending: Physician Assistant | Admitting: Physician Assistant

## 2020-12-05 ENCOUNTER — Other Ambulatory Visit: Payer: Self-pay

## 2020-12-05 DIAGNOSIS — G43809 Other migraine, not intractable, without status migrainosus: Secondary | ICD-10-CM | POA: Diagnosis not present

## 2020-12-05 LAB — CBC WITH DIFFERENTIAL/PLATELET
Abs Immature Granulocytes: 0 10*3/uL (ref 0.00–0.07)
Basophils Absolute: 0 10*3/uL (ref 0.0–0.1)
Basophils Relative: 1 %
Eosinophils Absolute: 0.2 10*3/uL (ref 0.0–0.5)
Eosinophils Relative: 4 %
HCT: 38.8 % (ref 36.0–46.0)
Hemoglobin: 11.9 g/dL — ABNORMAL LOW (ref 12.0–15.0)
Immature Granulocytes: 0 %
Lymphocytes Relative: 29 %
Lymphs Abs: 1.5 10*3/uL (ref 0.7–4.0)
MCH: 26 pg (ref 26.0–34.0)
MCHC: 30.7 g/dL (ref 30.0–36.0)
MCV: 84.7 fL (ref 80.0–100.0)
Monocytes Absolute: 0.4 10*3/uL (ref 0.1–1.0)
Monocytes Relative: 8 %
Neutro Abs: 3 10*3/uL (ref 1.7–7.7)
Neutrophils Relative %: 58 %
Platelets: 282 10*3/uL (ref 150–400)
RBC: 4.58 MIL/uL (ref 3.87–5.11)
RDW: 12.3 % (ref 11.5–15.5)
WBC: 5.1 10*3/uL (ref 4.0–10.5)
nRBC: 0 % (ref 0.0–0.2)

## 2020-12-05 LAB — TSH: TSH: 0.692 u[IU]/mL (ref 0.350–4.500)

## 2020-12-05 LAB — VITAMIN D 25 HYDROXY (VIT D DEFICIENCY, FRACTURES): Vit D, 25-Hydroxy: 10.84 ng/mL — ABNORMAL LOW (ref 30–100)

## 2020-12-05 NOTE — ED Triage Notes (Signed)
Pt presents for lab work.

## 2020-12-08 ENCOUNTER — Other Ambulatory Visit: Payer: Self-pay | Admitting: Physician Assistant

## 2020-12-08 ENCOUNTER — Telehealth: Payer: No Typology Code available for payment source | Admitting: Family

## 2020-12-08 DIAGNOSIS — E559 Vitamin D deficiency, unspecified: Secondary | ICD-10-CM | POA: Insufficient documentation

## 2020-12-08 DIAGNOSIS — K0889 Other specified disorders of teeth and supporting structures: Secondary | ICD-10-CM

## 2020-12-08 MED ORDER — VITAMIN D (ERGOCALCIFEROL) 1.25 MG (50000 UNIT) PO CAPS: 50000.0000 [IU] | ORAL_CAPSULE | ORAL | 2 refills | Status: DC

## 2020-12-08 MED FILL — VIT D2 1.25 MG (50,000 UNIT: 1.25 MG | 28 days supply | Qty: 4 | Fill #0

## 2020-12-08 NOTE — Addendum Note (Signed)
Addended by: Roney Jaffe on: 12/08/2020 01:20 PM   Modules accepted: Orders

## 2020-12-08 NOTE — Progress Notes (Signed)
Based on what you shared with me, I feel your condition warrants further evaluation and I recommend that you be seen for a face to face office visit.  Given amount of pain do you have any need to be seen face-to-face outer commend call in your dentist tomorrow morning.   NOTE: If you entered your credit card information for this eVisit, you will not be charged. You may see a "hold" on your card for the $35 but that hold will drop off and you will not have a charge processed.   If you are having a true medical emergency please call 911.      For an urgent face to face visit, [place] has five urgent care centers for your convenience:     [facility] Urgent Care Center at [facility] Directions [phone] [address] [place], [place] [address]  10 am - 6pm Monday - Friday    [facility] Urgent Care Center [facility]) Get Driving Directions [phone] [address] [place], [place] [address]  10 am to 8 pm Monday-Friday  12 pm to 8 pm [facility] Urgent Care at [facility] Get Driving Directions [phone] [address] [place] [address] [place], [place] [address]  8 am to 8 pm Monday-Friday  9 am to 6 pm Saturday  11 am to 6 pm Sunday     [facility] Urgent Care at [facility] Get Driving Directions  [phone] [address] [place], [place] [address]  8 am to 8 pm Monday-Friday  8 am to 4 pm [facility] Urgent Care at [facility] Directions [phone] [address] [place], [place] [address]  12 pm to 6 pm Monday-Friday      Your e-visit answers were reviewed by a board certified advanced clinical practitioner to complete your personal care plan.  Thank you for using e-Visits.

## 2020-12-11 NOTE — Telephone Encounter (Signed)
-----   Message from Roney Jaffe, [place] sent at 12/08/2020  1:20 PM EST ----- Please call patient and let her know that her thyroid function is within normal limits, she does not show signs of anemia.  Her vitamin D however is very low, she needs to take 50,000 units once a week for the next 12 weeks and then have it rechecked at that time.  This may offer her some relief from her headaches.  I have sent the prescription to her pharmacy.

## 2020-12-19 ENCOUNTER — Telehealth: Payer: No Typology Code available for payment source | Admitting: Nurse Practitioner

## 2020-12-19 DIAGNOSIS — Z831 Family history of other infectious and parasitic diseases: Secondary | ICD-10-CM

## 2020-12-19 MED ORDER — VALACYCLOVIR HCL 1 G PO TABS: ORAL_TABLET | ORAL | 0 refills | Status: DC

## 2020-12-19 NOTE — Progress Notes (Signed)
We are sorry that you are not feeling well.  Here is how we plan to help!  Based on what you have shared with me it does look like you have a viral infection.    Most cold sores or fever blisters are small fluid filled blisters around the mouth caused by herpes simplex virus.  The most common strain of the virus causing cold sores is herpes simplex virus 1.  It can be spread by skin contact, sharing eating utensils, or even sharing towels.  Cold sores are contagious to other people until dry. (Approximately 5-7 days).  Wash your hands. You can spread the virus to your eyes through handling your contact lenses after touching the lesions.  Most people experience pain at the sight or tingling sensations in their lips that may begin before the ulcers erupt.  Herpes simplex is treatable but not curable.  It may lie dormant for a long time and then reappear due to stress or prolonged sun exposure.  Many patients have success in treating their cold sores with an over the counter topical called Abreva.  You may apply the cream up to 5 times daily (maximum 10 days) until healing occurs.  If you would like to use an oral antiviral medication to speed the healing of your cold sore, I have sent a prescription to your local pharmacy Valacyclovir 2 gm take one by mouth twice a day for 1 day    YOU SHOULD NOT NEED DIFLUCAN. VALTREX IS NOT AN ANTIBIOTIC AND DOES NOT CAUSE YEAST INFECTION. ( please excuse the all caps, I just wanted to make sure you saw this in your note.). HOME CARE:   Wash your hands frequently.  Do not pick at or rub the sore.  Don't open the blisters.  Avoid kissing other people during this time.  Avoid sharing drinking glasses, eating utensils, or razors.  Do not handle contact lenses unless you have thoroughly washed your hands with soap and warm water!  Avoid oral sex during this time.  Herpes from sores on your mouth can spread to your partner's genital area.  Avoid contact with  anyone who has eczema or a weakened immune system.  Cold sores are often triggered by exposure to intense sunlight, use a lip balm containing a sunscreen (SPF 30 or higher).  GET HELP RIGHT AWAY IF:   Blisters look infected.  Blisters occur near or in the eye.  Symptoms last longer than 10 days.  Your symptoms become worse.  MAKE SURE YOU:   Understand these instructions.  Will watch your condition.  Will get help right away if you are not doing well or get worse.    Your e-visit answers were reviewed by a board certified advanced clinical practitioner to complete your personal care plan.  Depending upon the condition, your plan could have  Included both over the counter or prescription medications.    Please review your pharmacy choice.  Be sure that the pharmacy you have chosen is open so that you can pick up your prescription now.  If there is a problem you can message your provider in MyChart to have the prescription routed to another pharmacy.    Your safety is important to [place].  If you have drug allergies check our prescription carefully.  For the next 24 hours you can use MyChart to ask questions about today's visit, request a non-urgent call back, or ask for a work or school excuse from your e-visit provider.  You will  get an email in the next two days asking about your experience.  I hope that your e-visit has been valuable and will speed your recovery.  5-10 minutes spent reviewing and documenting in chart.

## 2020-12-30 ENCOUNTER — Telehealth: Payer: No Typology Code available for payment source | Admitting: Nurse Practitioner

## 2020-12-30 ENCOUNTER — Other Ambulatory Visit: Payer: Self-pay | Admitting: Nurse Practitioner

## 2020-12-30 DIAGNOSIS — B373 Candidiasis of vulva and vagina: Secondary | ICD-10-CM | POA: Diagnosis not present

## 2020-12-30 DIAGNOSIS — B3731 Acute candidiasis of vulva and vagina: Secondary | ICD-10-CM

## 2020-12-30 MED ORDER — FLUCONAZOLE 150 MG PO TABS
150.0000 mg | ORAL_TABLET | Freq: Once | ORAL | 0 refills | Status: DC
Start: 2020-12-30 — End: 2020-12-30

## 2020-12-30 NOTE — Progress Notes (Signed)

## 2020-12-31 MED FILL — FLUCONAZOLE 150 MG TABS: 150 | 1 days supply | Qty: 1 | Fill #0

## 2021-01-08 ENCOUNTER — Other Ambulatory Visit: Payer: Self-pay | Admitting: Family

## 2021-01-08 ENCOUNTER — Telehealth: Payer: No Typology Code available for payment source | Admitting: Family

## 2021-01-08 DIAGNOSIS — N760 Acute vaginitis: Secondary | ICD-10-CM | POA: Diagnosis not present

## 2021-01-08 DIAGNOSIS — B9689 Other specified bacterial agents as the cause of diseases classified elsewhere: Secondary | ICD-10-CM | POA: Diagnosis not present

## 2021-01-08 MED ORDER — METRONIDAZOLE 500 MG PO TABS
500.0000 mg | ORAL_TABLET | Freq: Two times a day (BID) | ORAL | 0 refills | Status: DC
Start: 2021-01-08 — End: 2021-01-08

## 2021-01-08 MED FILL — metroNIDAZOLE 500 MG TABS: 500 | 7 days supply | Qty: 14 | Fill #0

## 2021-01-08 NOTE — Progress Notes (Signed)

## 2021-01-11 ENCOUNTER — Telehealth: Payer: No Typology Code available for payment source | Admitting: Family

## 2021-01-11 DIAGNOSIS — N898 Other specified noninflammatory disorders of vagina: Secondary | ICD-10-CM

## 2021-01-11 NOTE — Progress Notes (Signed)
Based on what you shared with me, I feel your condition warrants further evaluation and I recommend that you be seen for a face to face office visit.  After reviewing your chart, you have been treated twice in the last two weeks for vaginal discharge. You need to be seen face to face for further testing.    NOTE: If you entered your credit card information for this eVisit, you will not be charged. You may see a "hold" on your card for the $35 but that hold will drop off and you will not have a charge processed.   If you are having a true medical emergency please call 911.      For an urgent face to face visit, [place] has five urgent care centers for your convenience:     [facility] Center at [facility] Directions [phone] [address] [place], [place] [address] . 10 am - 6pm Monday - Friday    [facility] Health Urgent Care Center [facility]) Get Driving Directions [phone] [address] [place], [place] [address] . 10 am to 8 pm Monday-Friday . 12 pm to 8 pm [facility] Urgent Care at [facility] Get Driving Directions [phone] [address] [place] [address] [place], [place] [address] . 8 am to 8 pm Monday-Friday . 9 am to 6 pm Saturday . 11 am to 6 pm Sunday     [facility] Health Urgent Care at [facility] Get Driving Directions  [phone] [address] [place], [place] [address] . 8 am to 8 pm Monday-Friday . 8 am to 4 pm [facility] Urgent Care at [facility] Directions [phone] [address] [place], [place] [address] . 12 pm to 6 pm Monday-Friday      Your e-visit answers were reviewed by a board certified advanced clinical practitioner to complete your personal care plan.  Thank you for using e-Visits.

## 2021-01-13 MED FILL — AMLODIPINE BESYLATE 5 MG TA: 5 | 30 days supply | Qty: 30 | Fill #2

## 2021-02-04 ENCOUNTER — Telehealth: Payer: No Typology Code available for payment source | Admitting: Nurse Practitioner

## 2021-02-04 ENCOUNTER — Other Ambulatory Visit: Payer: Self-pay | Admitting: Nurse Practitioner

## 2021-02-04 DIAGNOSIS — H5789 Other specified disorders of eye and adnexa: Secondary | ICD-10-CM

## 2021-02-04 MED ORDER — POLYMYXIN B-TRIMETHOPRIM 10000-0.1 UNIT/ML-% OP SOLN
2.0000 [drp] | OPHTHALMIC | 0 refills | Status: DC
Start: 2021-02-04 — End: 2021-02-04

## 2021-02-04 MED FILL — POLYMYXIN B/TMP EYE DROPS: 10000-0.1 | 8 days supply | Qty: 10 | Fill #0

## 2021-02-04 NOTE — Progress Notes (Signed)
E-Visit for Pink Eye   We are sorry that you are not feeling well.  Here is how we plan to help!  Based on what you have shared with me it looks like you have conjunctivitis.  Conjunctivitis is a common inflammatory or infectious condition of the eye that is often referred to as "pink eye".  In most cases it is contagious (viral or bacterial). However, not all conjunctivitis requires antibiotics (ex. Allergic).  We have made appropriate suggestions for you based upon your presentation.  I have prescribed Polytrim Ophthalmic drops 1-2 drops 4 times a day times 5 days  Pink eye can be highly contagious.  It is typically spread through direct contact with secretions, or contaminated objects or surfaces that one may have touched.  Strict handwashing is suggested with soap and water is urged.  If not available, use alcohol based had sanitizer.  Avoid unnecessary touching of the eye.  If you wear contact lenses, you will need to refrain from wearing them until you see no [place] discharge from the eye for at least 24 hours after being on medication.  You should see symptom improvement in 1-2 days after starting the medication regimen.  Call us if symptoms are not improved in 1-2 days.  Home Care:  Wash your hands often!  Do not wear your contacts until you complete your treatment plan.  Avoid sharing towels, bed linen, personal items with a person who has pink eye.  See attention for anyone in your home with similar symptoms.  Get Help Right Away If:  Your symptoms do not improve.  You develop blurred or loss of vision.  Your symptoms worsen (increased discharge, pain or redness)  Your e-visit answers were reviewed by a board certified advanced clinical practitioner to complete your personal care plan.  Depending on the condition, your plan could have included both over the counter or prescription medications.  If there is a problem please reply  once you have received a response from your  provider.  Your safety is important to us.  If you have drug allergies check your prescription carefully.    You can use MyChart to ask questions about today's visit, request a non-urgent call back, or ask for a work or school excuse for 24 hours related to this e-Visit. If it has been greater than 24 hours you will need to follow up with your provider, or enter a new e-Visit to address those concerns.   You will get an e-mail in the next two days asking about your experience.  I hope that your e-visit has been valuable and will speed your recovery. Thank you for using e-visits.   5-10 minutes spent reviewing and documenting in chart.    

## 2021-02-20 ENCOUNTER — Other Ambulatory Visit: Payer: Self-pay

## 2021-02-20 ENCOUNTER — Encounter (HOSPITAL_COMMUNITY): Payer: Self-pay | Admitting: Emergency Medicine

## 2021-02-20 ENCOUNTER — Ambulatory Visit (HOSPITAL_COMMUNITY)
Admission: EM | Admit: 2021-02-20 | Discharge: 2021-02-20 | Disposition: A | Discharge status: Home / Self Care | Payer: No Typology Code available for payment source | Attending: Emergency Medicine | Admitting: Emergency Medicine

## 2021-02-20 ENCOUNTER — Other Ambulatory Visit (HOSPITAL_COMMUNITY): Payer: Self-pay | Admitting: Emergency Medicine

## 2021-02-20 DIAGNOSIS — K0889 Other specified disorders of teeth and supporting structures: Secondary | ICD-10-CM

## 2021-02-20 MED ORDER — AMOXICILLIN 875 MG PO TABS: 875.0000 mg | ORAL_TABLET | Freq: Two times a day (BID) | ORAL | 0 refills | Status: DC

## 2021-02-20 MED ORDER — HYDROCODONE-ACETAMINOPHEN 5-325 MG PO TABS: 1.0000 | ORAL_TABLET | Freq: Four times a day (QID) | ORAL | 0 refills | Status: DC | PRN

## 2021-02-20 MED FILL — AMOXICILLIN 875 MG TABS: 875 | 7 days supply | Qty: 14 | Fill #0

## 2021-02-20 MED FILL — HYDROCODON-APAP 5-325: 5-325 | 1 days supply | Qty: 6 | Fill #0

## 2021-02-20 NOTE — ED Triage Notes (Signed)
Pt presents with left side dental pain xs 2 days. Has taken ibuprofen and tylenol with no relief.

## 2021-02-20 NOTE — ED Provider Notes (Signed)
[facility]    CSN: [id number] Arrival date & time: 02/20/21  2706      History   Chief Complaint Chief Complaint  Patient presents with  . Dental Pain    HPI Rachael Sherman is a 35 y.o. female. Patient presents with left upper and lower rear tooth aches x2 days. She has taken Tylenol and ibuprofen without relief. The pain is keeping her from being able to sleep. She denies fever, difficulty swallowing, difficulty breathing, or other symptoms. Her medical history hypertension, vitamin D deficiency, and endometriosis.  The history is provided by the patient and medical records.    Past Medical History:  Diagnosis Date  . Endometriosis   . Hypertension    [employer]reesia 11/25/2020    Patient Active Problem List   Diagnosis Date Noted  . Vitamin D deficiency 12/08/2020  . Migraine 11/26/2020  . Essential hypertension 01/31/2020  . Atypical chest pain 01/31/2020    Past Surgical History:  Procedure Laterality Date  . BREAST EXCISIONAL BIOPSY Left    fibroadenoma  . BREAST SURGERY     tumor (benign) left breast removal   . CESAREAN SECTION    . TUBAL LIGATION N/A    [employer]reesia 11/25/2020    OB History   No obstetric history on file.      Home Medications    Prior to Admission medications   Medication Sig Start Date End Date Taking? Authorizing Provider  amoxicillin (AMOXIL) 875 MG tablet Take 1 tablet (875 mg total) by mouth 2 (two) times daily for 7 days. 02/20/21 02/27/21 Yes Mickie Bail, NP  HYDROcodone-acetaminophen (NORCO/VICODIN) 5-325 MG tablet Take 1-2 tablets by mouth every 6 (six) hours as needed. 02/20/21  Yes Mickie Bail, NP  amitriptyline (ELAVIL) 10 MG tablet Take 1 tablet (10 mg total) by mouth at bedtime. 11/25/20   Mayers, Cari S, PA-C  amLODipine (NORVASC) 5 MG tablet Take 1 tablet (5 mg total) by mouth daily. 11/25/20   Mayers, Cari S, PA-C  hydrOXYzine (ATARAX/VISTARIL) 25 MG tablet Take 1 tablet (25 mg total) by mouth 3 (three) times  daily as needed. 08/08/19   Claiborne Rigg, NP  ondansetron (ZOFRAN) 8 MG tablet Take 1 tablet (8 mg total) by mouth every 8 (eight) hours as needed for nausea or vomiting. 11/25/20   Mayers, Cari S, PA-C  traZODone (DESYREL) 100 MG tablet Take 1 tablet (100 mg total) by mouth at bedtime. 07/18/19   Claiborne Rigg, NP  trimethoprim-polymyxin b (POLYTRIM) ophthalmic solution Place 2 drops into both eyes every 4 (four) hours. 02/04/21   Daphine Deutscher, Mary-Margaret, FNP  Ubrogepant (UBRELVY) 50 MG TABS Take 1 tablet by mouth daily as needed. Take 1 tablet at the onset of migraine. Can repeat in 2 hours if still having headache. Do not exceed 200 mg in one day. 11/25/20   Mayers, Cari S, PA-C  valACYclovir (VALTREX) 1000 MG tablet 2 tablets po 2x a day for 1 day 12/19/20   Bennie Pierini, FNP  Vitamin D, Ergocalciferol, (DRISDOL) 1.25 MG (50000 UNIT) CAPS capsule Take 1 capsule (50,000 Units total) by mouth every 7 (seven) days. 12/08/20   Mayers, Kasandra Knudsen, PA-C    Family History Family History  Problem Relation Age of Onset  . Healthy Mother   . Healthy Father     Social History Social History   Tobacco Use  . Smoking status: Never Smoker  . Smokeless tobacco: Never Used  Vaping Use  . Vaping Use: Never used  Substance Use Topics  . Alcohol use: Yes    Comment: socially  . Drug use: No     Allergies   Patient has no known allergies.   Review of Systems Review of Systems  Constitutional: Negative for chills and fever.  HENT: Positive for dental problem. Negative for ear pain and sore throat.   Eyes: Negative for pain and visual disturbance.  Respiratory: Negative for cough and shortness of breath.   Cardiovascular: Negative for chest pain and palpitations.  Gastrointestinal: Negative for abdominal pain and vomiting.  Genitourinary: Negative for dysuria and hematuria.  Musculoskeletal: Negative for arthralgias and back pain.  Skin: Negative for color change and rash.   Neurological: Negative for seizures and syncope.  All other systems reviewed and are negative.    Physical Exam Triage Vital Signs ED Triage Vitals  Enc Vitals Group     BP 02/20/21 0835 137/85     Pulse Rate 02/20/21 0835 73     Resp 02/20/21 0835 17     Temp 02/20/21 0835 98.5 F (36.9 C)     Temp Source 02/20/21 0835 Oral     SpO2 02/20/21 0835 99 %     Weight --      Height --      Head Circumference --      Peak Flow --      Pain Score 02/20/21 0839 8     Pain Loc --      Pain Edu? --      Excl. in GC? --    No data found.  Updated Vital Signs BP 137/85 (BP Location: Right Arm)   Pulse 73   Temp 98.5 F (36.9 C) (Oral)   Resp 17   SpO2 99%   Visual Acuity Right Eye Distance:   Left Eye Distance:   Bilateral Distance:    Right Eye Near:   Left Eye Near:    Bilateral Near:     Physical Exam Vitals and nursing note reviewed.  Constitutional:      General: She is not in acute distress.    Appearance: She is well-developed and well-nourished.  HENT:     Head: Normocephalic and atraumatic.     Mouth/Throat:     Mouth: Mucous membranes are moist.     Pharynx: Oropharynx is clear.   Eyes:     Conjunctiva/sclera: Conjunctivae normal.  Cardiovascular:     Rate and Rhythm: Normal rate and regular rhythm.     Heart sounds: Normal heart sounds.  Pulmonary:     Effort: Pulmonary effort is normal. No respiratory distress.     Breath sounds: Normal breath sounds.  Abdominal:     Palpations: Abdomen is soft.     Tenderness: There is no abdominal tenderness.  Musculoskeletal:        General: No edema.     Cervical back: Neck supple.  Skin:    General: Skin is warm and dry.  Neurological:     General: No focal deficit present.     Mental Status: She is alert and oriented to person, place, and time.     Gait: Gait normal.  Psychiatric:        Mood and Affect: Mood and affect and mood normal.        Behavior: Behavior normal.      UC Treatments /  Results  Labs (all labs ordered are listed, but only abnormal results are displayed) Labs Reviewed - No data to display  EKG  Radiology No results found.  Procedures Procedures (including critical care time)  Medications Ordered in UC Medications - No data to display  Initial Impression / Assessment and Plan / UC Course  I have reviewed the triage vital signs and the nursing notes.  Pertinent labs & imaging results that were available during my care of the patient were reviewed by me and considered in my medical decision making (see chart for details).   Dental pain. Treating with amoxicillin. Tylenol or ibuprofen as needed for mild to moderate pain. Norco as needed for moderate to severe pain. Precautions for drowsiness with Norco discussed. Dental resource guide provided and instructed patient to follow-up with her dentist soon as possible. She states she is attempting to get an appointment with her dentist for early next week. Patient agrees to plan of care.   Final Clinical Impressions(s) / UC Diagnoses   Final diagnoses:  Pain, dental     Discharge Instructions     Take the antibiotic as prescribed. Take Tylenol or ibuprofen as needed for mild to moderate pain. Take the Norco as needed for moderate to severe pain; do not drive, operate machinery, or drink alcohol with this medication as it will cause drowsiness.  A dental resource guide is attached.  Please call to make an appointment with a dentist as soon as possible.          ED Prescriptions    Medication Sig Dispense Auth. Provider   amoxicillin (AMOXIL) 875 MG tablet Take 1 tablet (875 mg total) by mouth 2 (two) times daily for 7 days. 14 tablet Wendee Beavers H, NP   HYDROcodone-acetaminophen (NORCO/VICODIN) 5-325 MG tablet Take 1-2 tablets by mouth every 6 (six) hours as needed. 6 tablet Mickie Bail, NP     I have reviewed the PDMP during this encounter.   Mickie Bail, NP 02/20/21 [phone]

## 2021-02-20 NOTE — Discharge Instructions (Addendum)
Take the antibiotic as prescribed. Take Tylenol or ibuprofen as needed for mild to moderate pain. Take the Norco as needed for moderate to severe pain; do not drive, operate machinery, or drink alcohol with this medication as it will cause drowsiness.  A dental resource guide is attached.  Please call to make an appointment with a dentist as soon as possible.

## 2021-02-25 ENCOUNTER — Other Ambulatory Visit (HOSPITAL_COMMUNITY): Payer: Self-pay

## 2021-02-25 MED FILL — AMOXICILLIN 500 MG CAPSULE: 500 | 8 days supply | Qty: 24 | Fill #0

## 2021-02-25 MED FILL — traMADol HCL 50 MG TABS: 50 | 3 days supply | Qty: 10 | Fill #0

## 2021-03-02 ENCOUNTER — Other Ambulatory Visit: Payer: Self-pay | Admitting: Family

## 2021-03-02 ENCOUNTER — Telehealth: Payer: No Typology Code available for payment source | Admitting: Family

## 2021-03-02 DIAGNOSIS — B373 Candidiasis of vulva and vagina: Secondary | ICD-10-CM | POA: Diagnosis not present

## 2021-03-02 DIAGNOSIS — B3731 Acute candidiasis of vulva and vagina: Secondary | ICD-10-CM

## 2021-03-02 MED ORDER — FLUCONAZOLE 150 MG PO TABS: 150.0000 mg | ORAL_TABLET | ORAL | 0 refills | Status: DC | PRN

## 2021-03-02 MED FILL — FLUCONAZOLE 150 MG TABS: 150 | 9 days supply | Qty: 3 | Fill #0

## 2021-03-02 NOTE — Progress Notes (Signed)

## 2021-03-12 ENCOUNTER — Other Ambulatory Visit: Payer: Self-pay | Admitting: Physician Assistant

## 2021-03-12 DIAGNOSIS — G43809 Other migraine, not intractable, without status migrainosus: Secondary | ICD-10-CM

## 2021-03-15 ENCOUNTER — Other Ambulatory Visit: Payer: Self-pay

## 2021-03-15 ENCOUNTER — Encounter (HOSPITAL_COMMUNITY): Payer: Self-pay

## 2021-03-15 ENCOUNTER — Emergency Department (HOSPITAL_BASED_OUTPATIENT_CLINIC_OR_DEPARTMENT_OTHER)
Admission: EM | Admit: 2021-03-15 | Discharge: 2021-03-15 | Disposition: A | Discharge status: Home / Self Care | Payer: No Typology Code available for payment source | Attending: Emergency Medicine | Admitting: Emergency Medicine

## 2021-03-15 ENCOUNTER — Ambulatory Visit (HOSPITAL_COMMUNITY)
Admission: EM | Admit: 2021-03-15 | Discharge: 2021-03-15 | Disposition: A | Discharge status: Home / Self Care | Payer: No Typology Code available for payment source | Attending: Urgent Care | Admitting: Urgent Care

## 2021-03-15 DIAGNOSIS — I10 Essential (primary) hypertension: Secondary | ICD-10-CM | POA: Insufficient documentation

## 2021-03-15 DIAGNOSIS — K0889 Other specified disorders of teeth and supporting structures: Secondary | ICD-10-CM | POA: Diagnosis not present

## 2021-03-15 DIAGNOSIS — R519 Headache, unspecified: Secondary | ICD-10-CM | POA: Insufficient documentation

## 2021-03-15 DIAGNOSIS — Z79899 Other long term (current) drug therapy: Secondary | ICD-10-CM | POA: Diagnosis not present

## 2021-03-15 LAB — CBC WITH DIFFERENTIAL/PLATELET
Abs Immature Granulocytes: 0.01 10*3/uL (ref 0.00–0.07)
Basophils Absolute: 0 10*3/uL (ref 0.0–0.1)
Basophils Relative: 0 %
Eosinophils Absolute: 0.1 10*3/uL (ref 0.0–0.5)
Eosinophils Relative: 3 %
HCT: 35.6 % — ABNORMAL LOW (ref 36.0–46.0)
Hemoglobin: 11.1 g/dL — ABNORMAL LOW (ref 12.0–15.0)
Immature Granulocytes: 0 %
Lymphocytes Relative: 23 %
Lymphs Abs: 1.1 10*3/uL (ref 0.7–4.0)
MCH: 26.2 pg (ref 26.0–34.0)
MCHC: 31.2 g/dL (ref 30.0–36.0)
MCV: 84 fL (ref 80.0–100.0)
Monocytes Absolute: 0.3 10*3/uL (ref 0.1–1.0)
Monocytes Relative: 6 %
Neutro Abs: 3.3 10*3/uL (ref 1.7–7.7)
Neutrophils Relative %: 68 %
Platelets: 234 10*3/uL (ref 150–400)
RBC: 4.24 MIL/uL (ref 3.87–5.11)
RDW: 12.7 % (ref 11.5–15.5)
WBC: 4.9 10*3/uL (ref 4.0–10.5)
nRBC: 0 % (ref 0.0–0.2)

## 2021-03-15 LAB — BASIC METABOLIC PANEL
Anion gap: 6 (ref 5–15)
BUN: 14 mg/dL (ref 6–20)
CO2: 27 mmol/L (ref 22–32)
Calcium: 9 mg/dL (ref 8.9–10.3)
Chloride: 103 mmol/L (ref 98–111)
Creatinine, Ser: 0.65 mg/dL (ref 0.44–1.00)
GFR, Estimated: >60 mL/min (ref >60)
Glucose, Bld: 113 mg/dL — ABNORMAL HIGH (ref 70–99)
Potassium: 3.6 mmol/L (ref 3.5–5.1)
Sodium: 136 mmol/L (ref 135–145)

## 2021-03-15 LAB — SEDIMENTATION RATE: Sed Rate: 14 mm/hr (ref 0–22)

## 2021-03-15 MED ORDER — KETOROLAC TROMETHAMINE 30 MG/ML IJ SOLN
15.0000 mg | Freq: Once | INTRAMUSCULAR | Status: AC
  Administered 2021-03-15: 15 mg via INTRAVENOUS
  Filled 2021-03-15: qty 1

## 2021-03-15 MED ORDER — HYDROCODONE-ACETAMINOPHEN 5-325 MG PO TABS: 1.0000 | ORAL_TABLET | Freq: Four times a day (QID) | ORAL | 0 refills | Status: DC | PRN

## 2021-03-15 MED ORDER — SODIUM CHLORIDE 0.9 % IV BOLUS
500.0000 mL | Freq: Once | INTRAVENOUS | Status: AC
  Administered 2021-03-15: 500 mL via INTRAVENOUS

## 2021-03-15 MED ORDER — AMOXICILLIN-POT CLAVULANATE 875-125 MG PO TABS: 1.0000 | ORAL_TABLET | Freq: Two times a day (BID) | ORAL | 0 refills | Status: DC

## 2021-03-15 MED ORDER — SUMATRIPTAN SUCCINATE 50 MG PO TABS: ORAL_TABLET | ORAL | 0 refills | Status: DC

## 2021-03-15 NOTE — ED Triage Notes (Signed)
Pt present dental pain on the left side. Pt states symptoms started two weeks ago. Pt states the toothache is causing her to have migraine

## 2021-03-15 NOTE — ED Triage Notes (Signed)
Pt reports left side facial pain 10/10  -  Onset today    pt  States she is been having toothache for few days and she has an appt to dentist to on April to take her wisdom tooth out .-denies fever

## 2021-03-15 NOTE — ED Provider Notes (Signed)
[facility] EMERGENCY DEPT Provider Note   CSN: [id number] Arrival date & time: 03/15/21  0059     History Chief Complaint  Patient presents with  . Facial Pain    Zan Triska is a 35 y.o. female.  Patient is a 35 year old female who presents with left-sided facial pain.  She said that it started gradually with some mild pain in her temple area earlier today and has radiated down into the left side of her face.  She denies any posterior neck pain.  No associated nausea or vomiting.  No vision changes. No photophobia.  She has been having problem with migraines but says that this feels different.  She denies any trauma to the area.  No rashes.  She has had some recent issues with her wisdom teeth but says that her teeth only have some mild pain currently.  She denies any sinus congestion.  No ear pain.  She has been using over-the-counter medicines with no improvement in symptoms.  She denies any dizziness.  No numbness or weakness to her extremities.  She says it hurts a little bit on the left side of her face when she chews.  She is currently on her menstrual cycle.  She says that it was a normal expected time for her.        Past Medical History:  Diagnosis Date  . Endometriosis   . Hypertension    [employer]reesia 11/25/2020    Patient Active Problem List   Diagnosis Date Noted  . Vitamin D deficiency 12/08/2020  . Migraine 11/26/2020  . Essential hypertension 01/31/2020  . Atypical chest pain 01/31/2020    Past Surgical History:  Procedure Laterality Date  . BREAST EXCISIONAL BIOPSY Left    fibroadenoma  . BREAST SURGERY     tumor (benign) left breast removal   . CESAREAN SECTION    . TUBAL LIGATION N/A    [employer]reesia 11/25/2020     OB History   No obstetric history on file.     Family History  Problem Relation Age of Onset  . Healthy Mother   . Healthy Father     Social History   Tobacco Use  . Smoking status: Never Smoker  . Smokeless  tobacco: Never Used  Vaping Use  . Vaping Use: Never used  Substance Use Topics  . Alcohol use: Yes    Comment: socially  . Drug use: No    Home Medications Prior to Admission medications   Medication Sig Start Date End Date Taking? Authorizing Provider  amitriptyline (ELAVIL) 10 MG tablet Take 1 tablet (10 mg total) by mouth at bedtime. 11/25/20   Mayers, Cari S, PA-C  amLODipine (NORVASC) 5 MG tablet Take 1 tablet (5 mg total) by mouth daily. 11/25/20   Mayers, Cari S, PA-C  fluconazole (DIFLUCAN) 150 MG tablet Take 1 tablet (150 mg total) by mouth every three (3) days as needed. 03/02/21   Junie Spencer, FNP  HYDROcodone-acetaminophen (NORCO/VICODIN) 5-325 MG tablet Take 1-2 tablets by mouth every 6 (six) hours as needed. 02/20/21   Mickie Bail, NP  hydrOXYzine (ATARAX/VISTARIL) 25 MG tablet Take 1 tablet (25 mg total) by mouth 3 (three) times daily as needed. 08/08/19   Claiborne Rigg, NP  ondansetron (ZOFRAN) 8 MG tablet Take 1 tablet (8 mg total) by mouth every 8 (eight) hours as needed for nausea or vomiting. 11/25/20   Mayers, Cari S, PA-C  traZODone (DESYREL) 100 MG tablet Take 1 tablet (100 mg total)  by mouth at bedtime. 07/18/19   Claiborne Rigg, NP  trimethoprim-polymyxin b (POLYTRIM) ophthalmic solution Place 2 drops into both eyes every 4 (four) hours. 02/04/21   Daphine Deutscher, Mary-Margaret, FNP  Ubrogepant (UBRELVY) 50 MG TABS Take 1 tablet by mouth daily as needed. Take 1 tablet at the onset of migraine. Can repeat in 2 hours if still having headache. Do not exceed 200 mg in one day. 11/25/20   Mayers, Cari S, PA-C  valACYclovir (VALTREX) 1000 MG tablet 2 tablets po 2x a day for 1 day 12/19/20   Bennie Pierini, FNP  Vitamin D, Ergocalciferol, (DRISDOL) 1.25 MG (50000 UNIT) CAPS capsule Take 1 capsule (50,000 Units total) by mouth every 7 (seven) days. 12/08/20   Mayers, Cari S, PA-C    Allergies    Patient has no known allergies.  Review of Systems   Review of Systems   Constitutional: Negative for chills, diaphoresis, fatigue and fever.  HENT: Negative for congestion, rhinorrhea and sneezing.        Facial pain  Eyes: Negative.   Respiratory: Negative for cough, chest tightness and shortness of breath.   Cardiovascular: Negative for chest pain and leg swelling.  Gastrointestinal: Negative for abdominal pain, blood in stool, diarrhea, nausea and vomiting.  Genitourinary: Negative for difficulty urinating, flank pain, frequency and hematuria.  Musculoskeletal: Negative for arthralgias and back pain.  Skin: Negative for rash.  Neurological: Positive for headaches. Negative for dizziness, speech difficulty, weakness and numbness.    Physical Exam Updated Vital Signs BP (!) 141/93   Pulse 74   Temp 98.6 F (37 C) (Oral)   Resp 16   Ht 5\' 2"  (1.575 m)   Wt 49.9 kg   SpO2 100%   BMI 20.12 kg/m   Physical Exam Constitutional:      Appearance: She is well-developed.  HENT:     Head: Normocephalic and atraumatic.     Comments: Tenderness over the left temple area and along the left TMJ.  There is no malocclusion noted.  No rashes are noted.  No pain on palpation of her teeth.  Her ear canal and TM look normal on the left. Eyes:     Pupils: Pupils are equal, round, and reactive to light.  Neck:     Comments: No meningismus Cardiovascular:     Rate and Rhythm: Normal rate and regular rhythm.     Heart sounds: Normal heart sounds.  Pulmonary:     Effort: Pulmonary effort is normal. No respiratory distress.     Breath sounds: Normal breath sounds. No wheezing or rales.  Chest:     Chest wall: No tenderness.  Abdominal:     General: Bowel sounds are normal.     Palpations: Abdomen is soft.     Tenderness: There is no abdominal tenderness. There is no guarding or rebound.  Musculoskeletal:        General: Normal range of motion.     Cervical back: Normal range of motion and neck supple.  Lymphadenopathy:     Cervical: No cervical adenopathy.   Skin:    General: Skin is warm and dry.     Findings: No rash.  Neurological:     Mental Status: She is alert and oriented to person, place, and time.     Comments: Motor 5/5 all extremities Sensation grossly intact to LT all extremities Finger to Nose intact, no pronator drift CN II-XII grossly intact Gait normal      ED Results / Procedures /  Treatments   Labs (all labs ordered are listed, but only abnormal results are displayed) Labs Reviewed  BASIC METABOLIC PANEL - Abnormal; Notable for the following components:      Result Value   Glucose, Bld 113 (*)    All other components within normal limits  CBC WITH DIFFERENTIAL/PLATELET - Abnormal; Notable for the following components:   Hemoglobin 11.1 (*)    HCT 35.6 (*)    All other components within normal limits  SEDIMENTATION RATE    EKG None  Radiology No results found.  Procedures Procedures   Medications Ordered in ED Medications  sodium chloride 0.9 % bolus 500 mL (500 mLs Intravenous New Bag/Given 03/15/21 0141)  ketorolac (TORADOL) 30 MG/ML injection 15 mg (15 mg Intravenous Given 03/15/21 0141)    ED Course  I have reviewed the triage vital signs and the nursing notes.  Pertinent labs & imaging results that were available during my care of the patient were reviewed by me and considered in my medical decision making (see chart for details).    MDM Rules/Calculators/A&P                          Patient is a 35 year old female who presents with left side headache/facial pain.  She did have some tenderness over her temporal artery and to the left side of the face.  Her sed rate is normal.  She does not have any vision changes or other suggestions of temporal arteritis.  She does not have any symptoms that would be more concerning for subarachnoid hemorrhage or meningitis.  It does not appear to be coming from her tooth at this time.  She does not have any sinus symptoms.  Her pain has almost completely  resolved with a dose of Toradol.  Her other labs are nonconcerning.  Her blood pressure was initially elevated but currently is 122/88.  She has no neurologic deficits.  She was discharged home in good condition.  She was encouraged to follow-up with her PCP.  Return precautions were given. Final Clinical Impression(s) / ED Diagnoses Final diagnoses:  Acute nonintractable headache, unspecified headache type    Rx / DC Orders ED Discharge Orders    None       Rolan Bucco, MD 03/15/21 [phone]

## 2021-03-15 NOTE — ED Provider Notes (Signed)
[facility]   MRN: [id number] DOB: 05/03/1986  Subjective:   Rachael Sherman is a 35 y.o. female presenting for 2-week history of persistent severe left-sided upper dental pain.  Patient is currently in the process of getting dental work done.  She is currently on amoxicillin and has been using tramadol with minimal relief of her pain.  Denies fever, nausea, vomiting, trismus.  No current facility-administered medications for this encounter.  Current Outpatient Medications:  .  amitriptyline (ELAVIL) 10 MG tablet, Take 1 tablet (10 mg total) by mouth at bedtime., Disp: 30 tablet, Rfl: 0 .  amLODipine (NORVASC) 5 MG tablet, Take 1 tablet (5 mg total) by mouth daily., Disp: 30 tablet, Rfl: 0 .  fluconazole (DIFLUCAN) 150 MG tablet, Take 1 tablet (150 mg total) by mouth every three (3) days as needed., Disp: 3 tablet, Rfl: 0 .  HYDROcodone-acetaminophen (NORCO/VICODIN) 5-325 MG tablet, Take 1-2 tablets by mouth every 6 (six) hours as needed., Disp: 6 tablet, Rfl: 0 .  hydrOXYzine (ATARAX/VISTARIL) 25 MG tablet, Take 1 tablet (25 mg total) by mouth 3 (three) times daily as needed., Disp: 60 tablet, Rfl: 1 .  ondansetron (ZOFRAN) 8 MG tablet, Take 1 tablet (8 mg total) by mouth every 8 (eight) hours as needed for nausea or vomiting., Disp: 20 tablet, Rfl: 0 .  traZODone (DESYREL) 100 MG tablet, Take 1 tablet (100 mg total) by mouth at bedtime., Disp: 30 tablet, Rfl: 1 .  trimethoprim-polymyxin b (POLYTRIM) ophthalmic solution, Place 2 drops into both eyes every 4 (four) hours., Disp: 10 mL, Rfl: 0 .  Ubrogepant (UBRELVY) 50 MG TABS, Take 1 tablet by mouth daily as needed. Take 1 tablet at the onset of migraine. Can repeat in 2 hours if still having headache. Do not exceed 200 mg in one day., Disp: 10 tablet, Rfl: 0 .  valACYclovir (VALTREX) 1000 MG tablet, 2 tablets po 2x a day for 1 day, Disp: 4 tablet, Rfl: 0 .  Vitamin D, Ergocalciferol, (DRISDOL) 1.25 MG (50000 UNIT) CAPS  capsule, Take 1 capsule (50,000 Units total) by mouth every 7 (seven) days., Disp: 4 capsule, Rfl: 2   No Known Allergies  Past Medical History:  Diagnosis Date  . Endometriosis   . Hypertension    [employer]reesia 11/25/2020     Past Surgical History:  Procedure Laterality Date  . BREAST EXCISIONAL BIOPSY Left    fibroadenoma  . BREAST SURGERY     tumor (benign) left breast removal   . CESAREAN SECTION    . TUBAL LIGATION N/A    [employer]reesia 11/25/2020    Family History  Problem Relation Age of Onset  . Healthy Mother   . Healthy Father     Social History   Tobacco Use  . Smoking status: Never Smoker  . Smokeless tobacco: Never Used  Vaping Use  . Vaping Use: Never used  Substance Use Topics  . Alcohol use: Yes    Comment: socially  . Drug use: No    ROS   Objective:   Vitals: BP (!) 160/94 (BP Location: Right Arm)   Pulse 75   Temp 98.9 F (37.2 C) (Temporal)   Resp 16   LMP 03/15/2021   SpO2 100%   Physical Exam Constitutional:      General: She is not in acute distress.    Appearance: Normal appearance. She is well-developed. She is not ill-appearing, toxic-appearing or diaphoretic.  HENT:     Head: Normocephalic and atraumatic.  Nose: Nose normal.     Mouth/Throat:     Mouth: Mucous membranes are moist.     Pharynx: Oropharynx is clear.   Eyes:     General: No scleral icterus.       Right eye: No discharge.        Left eye: No discharge.     Extraocular Movements: Extraocular movements intact.     Conjunctiva/sclera: Conjunctivae normal.     Pupils: Pupils are equal, round, and reactive to light.  Cardiovascular:     Rate and Rhythm: Normal rate.  Pulmonary:     Effort: Pulmonary effort is normal.  Skin:    General: Skin is warm and dry.  Neurological:     General: No focal deficit present.     Mental Status: She is alert and oriented to person, place, and time.  Psychiatric:        Mood and Affect: Mood normal.        Behavior: Behavior  normal.        Thought Content: Thought content normal.        Judgment: Judgment normal.     Results for orders placed or performed during the hospital encounter of 03/15/21 (from the past 24 hour(s))  Basic metabolic panel     Status: Abnormal   Collection Time: 03/15/21  1:38 AM  Result Value Ref Range   Sodium 136 135 - 145 mmol/L   Potassium 3.6 3.5 - 5.1 mmol/L   Chloride 103 98 - 111 mmol/L   CO2 27 22 - 32 mmol/L   Glucose, Bld 113 (H) 70 - 99 mg/dL   BUN 14 6 - 20 mg/dL   Creatinine, Ser 7.16 0.44 - 1.00 mg/dL   Calcium 9.0 8.9 - 96.7 mg/dL   GFR, Estimated >89 >38 mL/min   Anion gap 6 5 - 15  CBC with Differential     Status: Abnormal   Collection Time: 03/15/21  1:38 AM  Result Value Ref Range   WBC 4.9 4.0 - 10.5 K/uL   RBC 4.24 3.87 - 5.11 MIL/uL   Hemoglobin 11.1 (L) 12.0 - 15.0 g/dL   HCT 10.1 (L) 75.1 - 02.5 %   MCV 84.0 80.0 - 100.0 fL   MCH 26.2 26.0 - 34.0 pg   MCHC 31.2 30.0 - 36.0 g/dL   RDW 85.2 77.8 - 24.2 %   Platelets 234 150 - 400 K/uL   nRBC 0.0 0.0 - 0.2 %   Neutrophils Relative % 68 %   Neutro Abs 3.3 1.7 - 7.7 K/uL   Lymphocytes Relative 23 %   Lymphs Abs 1.1 0.7 - 4.0 K/uL   Monocytes Relative 6 %   Monocytes Absolute 0.3 0.1 - 1.0 K/uL   Eosinophils Relative 3 %   Eosinophils Absolute 0.1 0.0 - 0.5 K/uL   Basophils Relative 0 %   Basophils Absolute 0.0 0.0 - 0.1 K/uL   Immature Granulocytes 0 %   Abs Immature Granulocytes 0.01 0.00 - 0.07 K/uL  Sedimentation rate     Status: None   Collection Time: 03/15/21  1:38 AM  Result Value Ref Range   Sed Rate 14 0 - 22 mm/hr    Assessment and Plan :   I have reviewed the PDMP during this encounter.  1. Pain, dental     We will switch patient from amoxicillin to Augmentin.  Stop tramadol, schedule naproxen and use hydrocodone for breakthrough pain.  Maintain appointment with dentist. Counseled patient on potential  for adverse effects with medications prescribed/recommended today, ER and  return-to-clinic precautions discussed, patient verbalized understanding.    Wallis Bamberg, PA-C 03/18/21 1048

## 2021-03-16 ENCOUNTER — Other Ambulatory Visit: Payer: Self-pay | Admitting: Internal Medicine

## 2021-03-16 MED ORDER — UBRELVY 50 MG PO TABS: 1.0000 | ORAL_TABLET | Freq: Every day | ORAL | 0 refills | Status: DC | PRN

## 2021-03-16 MED FILL — UBRELVY 50 MG TABS: 50 | 30 days supply | Qty: 10 | Fill #0

## 2021-03-17 ENCOUNTER — Other Ambulatory Visit: Payer: Self-pay | Admitting: Physician Assistant

## 2021-03-17 DIAGNOSIS — G43809 Other migraine, not intractable, without status migrainosus: Secondary | ICD-10-CM

## 2021-03-23 NOTE — Telephone Encounter (Signed)
Please fill if appropriate for patient to continue usage.

## 2021-03-26 ENCOUNTER — Other Ambulatory Visit: Payer: Self-pay | Admitting: Internal Medicine

## 2021-03-26 MED ORDER — ONDANSETRON HCL 8 MG PO TABS: 8.0000 mg | ORAL_TABLET | Freq: Three times a day (TID) | ORAL | 0 refills | Status: DC | PRN

## 2021-04-06 ENCOUNTER — Telehealth: Payer: No Typology Code available for payment source | Admitting: Physician Assistant

## 2021-04-06 DIAGNOSIS — H539 Unspecified visual disturbance: Secondary | ICD-10-CM

## 2021-04-06 NOTE — Progress Notes (Signed)
No response yet from patient. As such, giving her mention of vision change even when eye is clear, recommend F2F evaluation.

## 2021-04-06 NOTE — Progress Notes (Signed)
Message sent to patient requesting further input regarding current symptoms. Awaiting patient response.  

## 2021-04-07 ENCOUNTER — Other Ambulatory Visit (HOSPITAL_COMMUNITY): Payer: Self-pay

## 2021-04-07 MED ORDER — POLYMYXIN B-TRIMETHOPRIM 10000-0.1 UNIT/ML-% OP SOLN
1.0000 [drp] | OPHTHALMIC | 0 refills | Status: DC
  Filled 2021-04-07: qty 10, 30d supply, fill #0

## 2021-04-07 MED FILL — Ergocalciferol Cap 1.25 MG (50000 Unit): ORAL | 28 days supply | Qty: 4 | Fill #0 | Status: AC

## 2021-04-07 MED FILL — Ondansetron HCl Tab 8 MG: ORAL | 7 days supply | Qty: 20 | Fill #0 | Status: AC

## 2021-04-07 MED FILL — Ubrogepant Tab 50 MG: ORAL | 30 days supply | Qty: 10 | Fill #0 | Status: AC

## 2021-04-13 ENCOUNTER — Other Ambulatory Visit (HOSPITAL_COMMUNITY): Payer: Self-pay

## 2021-04-13 MED ORDER — CHLORHEXIDINE GLUCONATE 0.12 % MT SOLN
OROMUCOSAL | 0 refills | Status: DC
  Filled 2021-04-13: qty 473, 16d supply, fill #0

## 2021-04-13 MED ORDER — DEXAMETHASONE 4 MG PO TABS
4.0000 mg | ORAL_TABLET | Freq: Three times a day (TID) | ORAL | 0 refills | Status: DC
  Filled 2021-04-13: qty 9, 3d supply, fill #0

## 2021-04-13 MED ORDER — AMOXICILLIN 500 MG PO CAPS
500.0000 mg | ORAL_CAPSULE | Freq: Three times a day (TID) | ORAL | 0 refills | Status: DC
  Filled 2021-04-13: qty 21, 7d supply, fill #0

## 2021-04-13 MED ORDER — IBUPROFEN 600 MG PO TABS
ORAL_TABLET | ORAL | 0 refills | Status: DC
  Filled 2021-04-13: qty 15, 4d supply, fill #0

## 2021-04-13 MED ORDER — HYDROCODONE-ACETAMINOPHEN 5-325 MG PO TABS
1.0000 | ORAL_TABLET | Freq: Four times a day (QID) | ORAL | 0 refills | Status: DC | PRN
  Filled 2021-04-13: qty 5, 2d supply, fill #0

## 2021-04-21 ENCOUNTER — Other Ambulatory Visit (HOSPITAL_COMMUNITY): Payer: Self-pay

## 2021-04-21 ENCOUNTER — Telehealth: Payer: No Typology Code available for payment source | Admitting: Nurse Practitioner

## 2021-04-21 DIAGNOSIS — N3000 Acute cystitis without hematuria: Secondary | ICD-10-CM | POA: Diagnosis not present

## 2021-04-21 MED ORDER — NITROFURANTOIN MONOHYD MACRO 100 MG PO CAPS
100.0000 mg | ORAL_CAPSULE | Freq: Two times a day (BID) | ORAL | 0 refills | Status: DC
  Filled 2021-04-21: qty 10, 5d supply, fill #0

## 2021-04-21 NOTE — Progress Notes (Signed)
We are sorry that you are not feeling well.  Here is how we plan to help!  Based on what you shared with me it looks like you most likely have a simple urinary tract infection.  A UTI (Urinary Tract Infection) is a bacterial infection of the bladder.  Most cases of urinary tract infections are simple to treat but a key part of your care is to encourage you to drink plenty of fluids and watch your symptoms carefully.  I have prescribed MacroBid 100 mg twice a day for 5 days.  Your symptoms should gradually improve. Call us if the burning in your urine worsens, you develop worsening fever, back pain or pelvic pain or if your symptoms do not resolve after completing the antibiotic.  Urinary tract infections can be prevented by drinking plenty of water to keep your body hydrated.  Also be sure when you wipe, wipe from front to back and don't hold it in!  If possible, empty your bladder every 4 hours.  Your e-visit answers were reviewed by a board certified advanced clinical practitioner to complete your personal care plan.  Depending on the condition, your plan could have included both over the counter or prescription medications.  If there is a problem please reply  once you have received a response from your provider.  Your safety is important to [place].  If you have drug allergies check your prescription carefully.    You can use MyChart to ask questions about today's visit, request a non-urgent call back, or ask for a work or school excuse for 24 hours related to this e-Visit. If it has been greater than 24 hours you will need to follow up with your provider, or enter a new e-Visit to address those concerns.   You will get an e-mail in the next two days asking about your experience.  I hope that your e-visit has been valuable and will speed your recovery. Thank you for using e-visits.  5 minutes were spend reviewing patient's chart history symptoms and documenting on patient's condition.   Meds  ordered this encounter  Medications  . nitrofurantoin, macrocrystal-monohydrate, (MACROBID) 100 MG capsule    Sig: Take 1 capsule (100 mg total) by mouth 2 (two) times daily for 5 days.    Dispense:  10 capsule    Refill:  0

## 2021-04-27 ENCOUNTER — Other Ambulatory Visit (HOSPITAL_BASED_OUTPATIENT_CLINIC_OR_DEPARTMENT_OTHER): Payer: Self-pay

## 2021-04-29 ENCOUNTER — Other Ambulatory Visit (HOSPITAL_COMMUNITY): Payer: Self-pay

## 2021-05-19 ENCOUNTER — Telehealth: Payer: No Typology Code available for payment source | Admitting: Physician Assistant

## 2021-05-19 ENCOUNTER — Other Ambulatory Visit (HOSPITAL_COMMUNITY): Payer: Self-pay

## 2021-05-19 DIAGNOSIS — N760 Acute vaginitis: Secondary | ICD-10-CM | POA: Diagnosis not present

## 2021-05-19 MED ORDER — METRONIDAZOLE 500 MG PO TABS
500.0000 mg | ORAL_TABLET | Freq: Two times a day (BID) | ORAL | 0 refills | Status: AC
  Filled 2021-05-19: qty 14, 7d supply, fill #0

## 2021-05-19 NOTE — Progress Notes (Signed)

## 2021-05-25 ENCOUNTER — Ambulatory Visit (HOSPITAL_COMMUNITY)
Admission: EM | Admit: 2021-05-25 | Discharge: 2021-05-25 | Disposition: A | Discharge status: Home / Self Care | Payer: No Typology Code available for payment source | Attending: Family Medicine | Admitting: Family Medicine

## 2021-05-25 ENCOUNTER — Encounter (HOSPITAL_COMMUNITY): Payer: Self-pay

## 2021-05-25 ENCOUNTER — Other Ambulatory Visit: Payer: Self-pay

## 2021-05-25 DIAGNOSIS — N750 Cyst of Bartholin's gland: Secondary | ICD-10-CM

## 2021-05-25 MED ORDER — CEPHALEXIN 500 MG PO CAPS
500.0000 mg | ORAL_CAPSULE | Freq: Two times a day (BID) | ORAL | 0 refills | Status: DC
  Filled 2021-05-25: qty 14, 7d supply, fill #0

## 2021-05-25 MED ORDER — CEPHALEXIN 500 MG PO CAPS: 500.0000 mg | ORAL_CAPSULE | Freq: Two times a day (BID) | ORAL | 0 refills | Status: DC

## 2021-05-25 NOTE — ED Triage Notes (Signed)
Pt reports Bartholin cyst x 1 day. Denies fever.

## 2021-05-25 NOTE — ED Provider Notes (Signed)
[facility]    CSN: [id number] Arrival date & time: 05/25/21  8003      History   Chief Complaint Chief Complaint  Patient presents with  . Cyst    HPI Rachael Sherman is a 35 y.o. female.   Patient presenting today with 1 day history of left labial pain, swelling, redness.  She states she was at the beach and riding bikes all weekend, thinks this flared things up.  History of Bartholin cyst in this area that typically self resolve but this 1 is larger than previous flares.  Has been using warm compresses so far without benefit.  Denies fever, chills, drainage from the area, concern for STDs.     Past Medical History:  Diagnosis Date  . Endometriosis   . Hypertension    [employer] 11/25/2020    Patient Active Problem List   Diagnosis Date Noted  . Vitamin D deficiency 12/08/2020  . Migraine 11/26/2020  . Essential hypertension 01/31/2020  . Atypical chest pain 01/31/2020    Past Surgical History:  Procedure Laterality Date  . BREAST EXCISIONAL BIOPSY Left    fibroadenoma  . BREAST SURGERY     tumor (benign) left breast removal   . CESAREAN SECTION    . TUBAL LIGATION N/A    [employer] 11/25/2020    OB History   No obstetric history on file.      Home Medications    Prior to Admission medications   Medication Sig Start Date End Date Taking? Authorizing Provider  metroNIDAZOLE (FLAGYL) 500 MG tablet Take 1 tablet (500 mg total) by mouth 2 (two) times daily for 7 days. 05/19/21 05/26/21  Muthersbaugh, Dahlia Client, PA-C  amitriptyline (ELAVIL) 10 MG tablet TAKE 1 TABLET (10 MG TOTAL) BY MOUTH AT BEDTIME. 11/25/20 11/25/21  Mayers, Cari S, PA-C  amLODipine (NORVASC) 5 MG tablet TAKE 1 TABLET (5 MG TOTAL) BY MOUTH DAILY. 11/25/20 11/25/21  Mayers, Cari S, PA-C  amLODipine (NORVASC) 5 MG tablet TAKE 1 TABLET BY MOUTH DAILY 08/14/20 08/14/21  Mardella Layman, MD  cephALEXin (KEFLEX) 500 MG capsule Take 1 capsule (500 mg total) by mouth 2 (two) times daily. 05/25/21    Particia Nearing, PA-C  chlorhexidine (PERIDEX) 0.12 % solution Rinse mouth with 14ml (1 capful) for 30 seconds in the morning and evening after toothbrushing. Expectorate after rinsing. Do not swallow. 04/13/21     dexamethasone (DECADRON) 4 MG tablet Take 1 tablet (4 mg total) by mouth 3 (three) times daily. 04/13/21     fluconazole (DIFLUCAN) 150 MG tablet TAKE 1 TABLET (150 MG TOTAL) BY MOUTH EVERY THREE (3) DAYS AS NEEDED. 03/02/21 03/02/22  Jannifer Rodney A, FNP  HYDROcodone-acetaminophen (NORCO/VICODIN) 5-325 MG tablet Take 1 tablet by mouth every 6 (six) hours as needed for severe pain. 03/15/21   Wallis Bamberg, PA-C  HYDROcodone-acetaminophen (NORCO/VICODIN) 5-325 MG tablet Take 1 tablet by mouth every 6 (six) hours as needed. 04/13/21     hydrOXYzine (ATARAX/VISTARIL) 25 MG tablet Take 1 tablet (25 mg total) by mouth 3 (three) times daily as needed. 08/08/19   Claiborne Rigg, NP  ibuprofen (ADVIL) 600 MG tablet Take 1 tablet by mouth every 6 to 8 hours as needed. 04/13/21     naproxen (NAPROSYN) 500 MG tablet TAKE 1 TABLET (500 MG TOTAL) BY MOUTH 2 (TWO) TIMES DAILY FOR 7 DAYS. 11/07/20 11/07/21  Couture, Cortni S, PA-C  ondansetron (ZOFRAN) 8 MG tablet Take 1 tablet (8 mg total) by mouth every 8 (eight) hours  as needed for nausea and vomiting. 03/26/21   Arvilla Market, DO  SUMAtriptan (IMITREX) 50 MG tablet Take 1 tablet at the start of a migraine. Repeat in 2 hours if needed. Do not take more than 2 tablets in a day. 03/15/21   Wallis Bamberg, PA-C  traMADol (ULTRAM) 50 MG tablet TAKE 1 TABLET BY MOUTH EVERY SIX HOURS 02/25/21 08/24/21    traZODone (DESYREL) 100 MG tablet Take 1 tablet (100 mg total) by mouth at bedtime. 07/18/19   Claiborne Rigg, NP  trimethoprim-polymyxin b (POLYTRIM) ophthalmic solution PLACE 2 DROPS INTO BOTH EYES EVERY 4 HOURS. 02/04/21 02/04/22  Bennie Pierini, FNP  trimethoprim-polymyxin b (POLYTRIM) ophthalmic solution Place 1 drop into the affected eye every 4  (four) hours while awake. 04/06/21   Ivette Loyal, NP  Ubrogepant 50 MG TABS TAKE 1 TABLET AT THE ONSET OF MIGRAINE. CAN REPEAT IN 2 HOURS IF STILL HAVING HEADACHE. DO NOT EXCEED 200MG 'S IN 1 DAY 03/16/21 03/16/22  03/18/22, DO  valACYclovir (VALTREX) 1000 MG tablet 2 tablets po 2x a day for 1 day 12/19/20   12/21/20, FNP  Vitamin D, Ergocalciferol, (DRISDOL) 1.25 MG (50000 UNIT) CAPS capsule TAKE 1 CAPSULE (50,000 UNITS TOTAL) BY MOUTH EVERY 7 (SEVEN) DAYS. 12/08/20 12/08/21  Mayers, 12/10/21, PA-C    Family History Family History  Problem Relation Age of Onset  . Healthy Mother   . Healthy Father     Social History Social History   Tobacco Use  . Smoking status: Never Smoker  . Smokeless tobacco: Never Used  Vaping Use  . Vaping Use: Never used  Substance Use Topics  . Alcohol use: Yes    Comment: socially  . Drug use: No     Allergies   Patient has no known allergies.   Review of Systems Review of Systems Per HPI  Physical Exam Triage Vital Signs ED Triage Vitals  Enc Vitals Group     BP --      Pulse Rate 05/25/21 0958 (P) 77     Resp 05/25/21 0958 (P) 16     Temp --      Temp Source 05/25/21 0958 (P) Oral     SpO2 05/25/21 0958 (P) 100 %     Weight --      Height --      Head Circumference --      Peak Flow --      Pain Score 05/25/21 1001 8     Pain Loc --      Pain Edu? --      Excl. in GC? --    No data found.  Updated Vital Signs Pulse (P) 77   Resp (P) 16   LMP 05/02/2021 (Exact Date)   SpO2 (P) 100%   Visual Acuity Right Eye Distance:   Left Eye Distance:   Bilateral Distance:    Right Eye Near:   Left Eye Near:    Bilateral Near:     Physical Exam Vitals and nursing note reviewed.  Constitutional:      Appearance: Normal appearance. She is not ill-appearing.  HENT:     Head: Atraumatic.  Eyes:     Extraocular Movements: Extraocular movements intact.     Conjunctiva/sclera: Conjunctivae normal.   Cardiovascular:     Rate and Rhythm: Normal rate and regular rhythm.     Heart sounds: Normal heart sounds.  Pulmonary:     Effort: Pulmonary effort is normal.  Breath sounds: Normal breath sounds.  Genitourinary:    Comments: GU exam declined today. Musculoskeletal:        General: Normal range of motion.     Cervical back: Normal range of motion and neck supple.  Skin:    General: Skin is warm and dry.  Neurological:     Mental Status: She is alert and oriented to person, place, and time.  Psychiatric:        Mood and Affect: Mood normal.        Thought Content: Thought content normal.        Judgment: Judgment normal.      UC Treatments / Results  Labs (all labs ordered are listed, but only abnormal results are displayed) Labs Reviewed - No data to display  EKG   Radiology No results found.  Procedures Procedures (including critical care time)  Medications Ordered in UC Medications - No data to display  Initial Impression / Assessment and Plan / UC Course  I have reviewed the triage vital signs and the nursing notes.  Pertinent labs & imaging results that were available during my care of the patient were reviewed by me and considered in my medical decision making (see chart for details).     Discussed with patient poor utility in draining early Bartholin cyst, will start antibiotics in case becoming infected, continue warm Epson salt soaks and warm compresses, over-the-counter pain relievers.  Close GYN follow-up if not resolving or worsening for further intervention.  Final Clinical Impressions(s) / UC Diagnoses   Final diagnoses:  Infected cyst of Bartholin's gland duct   Discharge Instructions   None    ED Prescriptions    Medication Sig Dispense Auth. Provider   cephALEXin (KEFLEX) 500 MG capsule  (Status: Discontinued) Take 1 capsule (500 mg total) by mouth 2 (two) times daily. 14 capsule Particia Nearing, [place]   cephALEXin (KEFLEX) 500  MG capsule Take 1 capsule (500 mg total) by mouth 2 (two) times daily. 14 capsule Particia Nearing, [place]     PDMP not reviewed this encounter.   Particia Nearing, [place] 05/25/21 1200

## 2021-05-26 ENCOUNTER — Other Ambulatory Visit (HOSPITAL_COMMUNITY): Payer: Self-pay

## 2021-05-27 ENCOUNTER — Other Ambulatory Visit: Payer: Self-pay | Admitting: Family

## 2021-05-27 ENCOUNTER — Other Ambulatory Visit: Payer: Self-pay

## 2021-05-28 ENCOUNTER — Other Ambulatory Visit: Payer: Self-pay | Admitting: Internal Medicine

## 2021-05-28 ENCOUNTER — Other Ambulatory Visit (HOSPITAL_COMMUNITY): Payer: Self-pay

## 2021-05-28 MED ORDER — FLUCONAZOLE 150 MG PO TABS
150.0000 mg | ORAL_TABLET | ORAL | 0 refills | Status: DC | PRN
  Filled 2021-05-28: qty 3, 9d supply, fill #0

## 2021-05-29 ENCOUNTER — Telehealth: Payer: No Typology Code available for payment source | Admitting: Emergency Medicine

## 2021-05-29 ENCOUNTER — Other Ambulatory Visit (HOSPITAL_COMMUNITY): Payer: Self-pay

## 2021-05-29 DIAGNOSIS — B001 Herpesviral vesicular dermatitis: Secondary | ICD-10-CM

## 2021-05-29 MED ORDER — VALACYCLOVIR HCL 1 G PO TABS
2000.0000 mg | ORAL_TABLET | Freq: Two times a day (BID) | ORAL | 0 refills | Status: AC
  Filled 2021-05-29: qty 4, 1d supply, fill #0

## 2021-05-29 NOTE — Progress Notes (Signed)
We are sorry that you are not feeling well.  Here is how we plan to help!  Based on what you have shared with me it does look like you have a viral infection.    Most cold sores or fever blisters are small fluid filled blisters around the mouth caused by herpes simplex virus.  The most common strain of the virus causing cold sores is herpes simplex virus 1.  It can be spread by skin contact, sharing eating utensils, or even sharing towels.  Cold sores are contagious to other people until dry. (Approximately 5-7 days).  Wash your hands. You can spread the virus to your eyes through handling your contact lenses after touching the lesions.  Most people experience pain at the sight or tingling sensations in their lips that may begin before the ulcers erupt.  Herpes simplex is treatable but not curable.  It may lie dormant for a long time and then reappear due to stress or prolonged sun exposure.  Many patients have success in treating their cold sores with an over the counter topical called Abreva.  You may apply the cream up to 5 times daily (maximum 10 days) until healing occurs.  If you would like to use an oral antiviral medication to speed the healing of your cold sore, I have sent a prescription to your local pharmacy Valacyclovir 2 gm take one by mouth twice a day for 1 day.    HOME CARE:  Wash your hands frequently. Do not pick at or rub the sore. Don't open the blisters. Avoid kissing other people during this time. Avoid sharing drinking glasses, eating utensils, or razors. Do not handle contact lenses unless you have thoroughly washed your hands with soap and warm water! Avoid oral sex during this time.  Herpes from sores on your mouth can spread to your partner's genital area. Avoid contact with anyone who has eczema or a weakened immune system. Cold sores are often triggered by exposure to intense sunlight, use a lip balm containing a sunscreen (SPF 30 or higher).  GET HELP RIGHT AWAY  IF:  Blisters look infected. Blisters occur near or in the eye. Symptoms last longer than 10 days. Your symptoms become worse.  MAKE SURE YOU:  Understand these instructions. Will watch your condition. Will get help right away if you are not doing well or get worse.    Your e-visit answers were reviewed by a board certified advanced clinical practitioner to complete your personal care plan.  Depending upon the condition, your plan could have  Included both over the counter or prescription medications.    Please review your pharmacy choice.  Be sure that the pharmacy you have chosen is open so that you can pick up your prescription now.  If there is a problem you can message your provider in MyChart to have the prescription routed to another pharmacy.    Your safety is important to us.  If you have drug allergies check our prescription carefully.  For the next 24 hours you can use MyChart to ask questions about today's visit, request a non-urgent call back, or ask for a work or school excuse from your e-visit provider.  You will get an email in the next two days asking about your experience.  I hope that your e-visit has been valuable and will speed your recovery.  Approximately 5 minutes was spent documenting and reviewing patient's chart.    

## 2021-06-14 ENCOUNTER — Telehealth: Payer: No Typology Code available for payment source | Admitting: Physician Assistant

## 2021-06-14 DIAGNOSIS — L309 Dermatitis, unspecified: Secondary | ICD-10-CM | POA: Diagnosis not present

## 2021-06-15 ENCOUNTER — Other Ambulatory Visit (HOSPITAL_COMMUNITY): Payer: Self-pay

## 2021-06-15 MED ORDER — TRIAMCINOLONE ACETONIDE 0.1 % EX CREA
1.0000 "application " | TOPICAL_CREAM | Freq: Two times a day (BID) | CUTANEOUS | 0 refills | Status: DC
  Filled 2021-06-15: qty 30, 15d supply, fill #0

## 2021-06-15 NOTE — Progress Notes (Signed)
E-Visit for Eczema  We are sorry that you are not feeling well. Here is how we plan to help! Based on what you shared with me it looks like you have eczema (atopic dermatitis).  Although the cause of eczema is not completely understood, genetics appear to play a strong role, and people with a family history of eczema are at increased risk of developing the condition. In most people with eczema, there is a genetic abnormality in the outermost layer of the skin, called the epidermis   Most people with eczema develop their first symptoms as children, before the age of 76. Intense itching of the skin, patches of redness, small bumps, and skin flaking are common. Scratching can further inflame the skin and worsen the itching. The itchiness may be more noticeable at nighttime.  Eczema commonly affects the back of the neck, the elbow creases, and the backs of the knees. Other affected areas may include the face, wrists, and forearms. The skin may become thickened and darkened, or even scarred, from repeated scratching. Eliminating factors that aggravate your eczema symptoms can help to control the symptoms. Possible triggers may include: ? Cold or dry environments ? Sweating ? Emotional stress or anxiety ? Rapid temperature changes ? Exposure to certain chemicals or cleaning solutions, including soaps and detergents, perfumes and cosmetics, wool or synthetic fibers, dust, sand, and cigarette smoke Keeping your skin hydrated Emollients -- Emollients are creams and ointments that moisturize the skin and prevent it from drying out. The best emollients for people with eczema are thick creams (such as Eucerin, Cetaphil, and Nutraderm) or ointments (such as petroleum jelly, Aquaphor, and Vaseline), which contain little to no water. Emollients are most effective when applied immediately after bathing. Emollients can be applied twice a day or more often if needed. Lotions contain more water than creams and  ointments and are less effective for moisturizing the skin. Bathing -- It is not clear if showers or baths are better for keeping the skin hydrated. Lukewarm baths or showers can hydrate and cool the skin, temporarily relieving itching from eczema. An unscented, mild soap or non-soap cleanser (such as Cetaphil) should be used sparingly. Apply an emollient immediately after bathing or showering to prevent your skin from drying out as a result of water evaporation. Emollient bath additives (products you add to the bath water) have not been found to help relieve symptoms. Hot or long baths (more than 10 to 15 minutes) and showers should be avoided since they can dry out the skin.  Based on what you shared with me you may have eczema.   Triamcinalone ointment (or cream). Apply to the effected areas twice per day. I have sent a prescription for this medication to your pharmacy.  I recommend dilute bleach baths for people with eczema. These baths help to decrease the number of bacteria on the skin that can cause infections or worsen symptoms. To prepare a bleach bath, one-fourth to one-half cup of bleach is placed in a full bathtub (about 40 gallons) of water. Bleach baths are usually taken for 5 to 10 minutes twice per week and should be followed by application of an emollient (listed above). I recommend you take Benadryl 25mg  - 50mg  every 4 hours to control the symptoms (including itching) but if they last over 24 hours it is best that you see an office based provider for follow up.  HOME CARE: Take lukewarm showers or baths Apply creams and ointments to prevent the skin from drying (  Eucerin, Cetaphil, Nutraderm, petroleum jelly, Aquaphor or Vaseline) - these products contain less water than other lotions and are more effective for moisturizing the skin Limit exposure to cold or dry environments, sweating, emotional stress and anxiety, rapid temperature changes and exposure to chemicals and cleaning  products, soaps and detergents, perfumes, cosmetics, wool and synthetic fibers, dust, sand and cigarette- factors which can aggravate eczema symptoms.  Use a hydrocortisone cream once or twice a day Take an antihistamine like Benadryl for widespread rashes that itch.  The adult dosage of Benadryl is 25-50 mg by mouth 4 times daily. Caution: This type of medication may cause sleepiness.  Do not drink alcohol, drive, or operate dangerous machinery while taking antihistamines.  Do not take these medications if you have prostate enlargement.  Read the package instructions thoroughly on all medications that you take.  GET HELP RIGHT AWAY IF: Symptoms that don't go away after treatment. Severe itching that persists. You develop a fever. Your skin begins to drain. You have a sore throat. You become short of breath.  MAKE SURE YOU   Understand these instructions. Will watch your condition. Will get help right away if you are not doing well or get worse.  Thank you for choosing an e-visit. Your e-visit answers were reviewed by a board certified advanced clinical practitioner to complete your personal care plan. Depending upon the condition, your plan could have included both over the counter or prescription medications. Please review your pharmacy choice. Make sure the pharmacy is open so you can pick up prescription now. If there is a problem, you may contact your provider through [employer] and have the prescription routed to another pharmacy. Your safety is important to [place]. If you have drug allergies check your prescription carefully.  For the next 24 hours you can use MyChart to ask questions about today's visit, request a non-urgent call back, or ask for a work or school excuse. You will get an email in the next two days asking about your experience. I hope that your e-visit has been valuable and will speed your recovery    Approximately 5 minutes was spent documenting and reviewing  patient's chart.

## 2021-07-18 ENCOUNTER — Telehealth: Payer: No Typology Code available for payment source | Admitting: Nurse Practitioner

## 2021-07-18 DIAGNOSIS — R110 Nausea: Secondary | ICD-10-CM

## 2021-07-18 MED ORDER — ONDANSETRON HCL 4 MG PO TABS: 4.0000 mg | ORAL_TABLET | Freq: Three times a day (TID) | ORAL | 0 refills | Status: DC | PRN

## 2021-07-18 NOTE — Progress Notes (Signed)
E-Visit for Nausea and Vomiting   We are sorry that you are not feeling well. Here is how we plan to help!  Based on what you have shared with me it looks like you have a Virus that is irritating your GI tract.  Vomiting is the forceful emptying of a portion of the stomach's content through the mouth.  Although nausea and vomiting can make you feel miserable, it's important to remember that these are not diseases, but rather symptoms of an underlying illness.  When we treat short term symptoms, we always caution that any symptoms that persist should be fully evaluated in a medical office.  I have prescribed a medication that will help alleviate your symptoms and allow you to stay hydrated:  Zofran 4 mg 1 tablet every 8 hours as needed for nausea and vomiting  HOME CARE: Drink clear liquids.  This is very important! Dehydration (the lack of fluid) can lead to a serious complication.  Start off with 1 tablespoon every 5 minutes for 8 hours. You may begin eating bland foods after 8 hours without vomiting.  Start with saltine crackers, Bentler bread, rice, mashed potatoes, applesauce. After 48 hours on a bland diet, you may resume a normal diet. Try to go to sleep.  Sleep often empties the stomach and relieves the need to vomit.  GET HELP RIGHT AWAY IF:  Your symptoms do not improve or worsen within 2 days after treatment. You have a fever for over 3 days. You cannot keep down fluids after trying the medication.  MAKE SURE YOU:  Understand these instructions. Will watch your condition. Will get help right away if you are not doing well or get worse.    Thank you for choosing an e-visit.  Your e-visit answers were reviewed by a board certified advanced clinical practitioner to complete your personal care plan. Depending upon the condition, your plan could have included both over the counter or prescription medications.  Please review your pharmacy choice. Make sure the pharmacy is open so  you can pick up prescription now. If there is a problem, you may contact your provider through [employer] and have the prescription routed to another pharmacy.  Your safety is important to [place]. If you have drug allergies check your prescription carefully.   For the next 24 hours you can use MyChart to ask questions about today's visit, request a non-urgent call back, or ask for a work or school excuse. You will get an email in the next two days asking about your experience. I hope that your e-visit has been valuable and will speed your recovery.  5-10 minutes spent reviewing and documenting in chart.

## 2021-08-02 ENCOUNTER — Telehealth: Payer: No Typology Code available for payment source | Admitting: Physician Assistant

## 2021-08-02 DIAGNOSIS — J019 Acute sinusitis, unspecified: Secondary | ICD-10-CM

## 2021-08-02 DIAGNOSIS — B9689 Other specified bacterial agents as the cause of diseases classified elsewhere: Secondary | ICD-10-CM

## 2021-08-02 MED ORDER — AMOXICILLIN-POT CLAVULANATE 875-125 MG PO TABS: 1.0000 | ORAL_TABLET | Freq: Two times a day (BID) | ORAL | 0 refills | Status: DC

## 2021-08-02 NOTE — Progress Notes (Signed)
I have spent 5 minutes in review of e-visit questionnaire, review and updating patient chart, medical decision making and response to patient.   Ayen Viviano Cody Cesily Cuoco, PA-C    

## 2021-08-02 NOTE — Progress Notes (Signed)

## 2021-08-17 ENCOUNTER — Telehealth: Payer: No Typology Code available for payment source | Admitting: Physician Assistant

## 2021-08-17 DIAGNOSIS — B001 Herpesviral vesicular dermatitis: Secondary | ICD-10-CM | POA: Diagnosis not present

## 2021-08-17 MED ORDER — VALACYCLOVIR HCL 1 G PO TABS
2000.0000 mg | ORAL_TABLET | Freq: Two times a day (BID) | ORAL | 0 refills | Status: AC
  Filled 2021-08-17: qty 4, 1d supply, fill #0

## 2021-08-17 NOTE — Progress Notes (Signed)

## 2021-08-18 ENCOUNTER — Other Ambulatory Visit (HOSPITAL_COMMUNITY): Payer: Self-pay

## 2021-08-20 ENCOUNTER — Other Ambulatory Visit (HOSPITAL_COMMUNITY): Payer: Self-pay

## 2021-08-20 MED FILL — Ergocalciferol Cap 1.25 MG (50000 Unit): ORAL | 28 days supply | Qty: 4 | Fill #1 | Status: CN

## 2021-08-21 ENCOUNTER — Other Ambulatory Visit (HOSPITAL_COMMUNITY): Payer: Self-pay

## 2021-09-01 ENCOUNTER — Other Ambulatory Visit (HOSPITAL_COMMUNITY): Payer: Self-pay

## 2021-09-10 ENCOUNTER — Telehealth: Payer: No Typology Code available for payment source | Admitting: Physician Assistant

## 2021-09-10 ENCOUNTER — Other Ambulatory Visit (HOSPITAL_COMMUNITY): Payer: Self-pay

## 2021-09-10 DIAGNOSIS — R110 Nausea: Secondary | ICD-10-CM | POA: Diagnosis not present

## 2021-09-10 MED ORDER — ONDANSETRON 4 MG PO TBDP
4.0000 mg | ORAL_TABLET | Freq: Three times a day (TID) | ORAL | 0 refills | Status: DC | PRN
  Filled 2021-09-10: qty 20, 7d supply, fill #0

## 2021-09-10 MED FILL — Ergocalciferol Cap 1.25 MG (50000 Unit): ORAL | 28 days supply | Qty: 4 | Fill #1 | Status: AC

## 2021-09-10 NOTE — Progress Notes (Signed)
I have spent 5 minutes in review of e-visit questionnaire, review and updating patient chart, medical decision making and response to patient.   Graceanna Theissen Cody Zanaya Baize, PA-C    

## 2021-09-10 NOTE — Progress Notes (Signed)
E-Visit for Nausea and Vomiting   We are sorry that you are not feeling well. Here is how we plan to help!  Based on what you have shared with me it looks like you have nausea secondary to acute migraine that you have a history of. Please continue care for your migraines as previously directed by the treating provider.   I have prescribed a medication that will help alleviate your symptoms and allow you to stay hydrated:  Zofran 4 mg 1 tablet every 8 hours as needed for nausea and vomiting  HOME CARE: Drink clear liquids.  This is very important! Dehydration (the lack of fluid) can lead to a serious complication.  Start off with 1 tablespoon every 5 minutes for 8 hours. You may begin eating bland foods after 8 hours without vomiting.  Start with saltine crackers, Munoz bread, rice, mashed potatoes, applesauce. After 48 hours on a bland diet, you may resume a normal diet. Try to go to sleep.  Sleep often empties the stomach and relieves the need to vomit.  GET HELP RIGHT AWAY IF:  Your symptoms do not improve or worsen within 2 days after treatment. You have a fever for over 3 days. You cannot keep down fluids after trying the medication.  MAKE SURE YOU:  Understand these instructions. Will watch your condition. Will get help right away if you are not doing well or get worse.    Thank you for choosing an e-visit.  Your e-visit answers were reviewed by a board certified advanced clinical practitioner to complete your personal care plan. Depending upon the condition, your plan could have included both over the counter or prescription medications.  Please review your pharmacy choice. Make sure the pharmacy is open so you can pick up prescription now. If there is a problem, you may contact your provider through [employer] and have the prescription routed to another pharmacy.  Your safety is important to [place]. If you have drug allergies check your prescription carefully.   For the  next 24 hours you can use MyChart to ask questions about today's visit, request a non-urgent call back, or ask for a work or school excuse. You will get an email in the next two days asking about your experience. I hope that your e-visit has been valuable and will speed your recovery.

## 2021-11-23 ENCOUNTER — Other Ambulatory Visit: Payer: Self-pay | Admitting: Physician Assistant

## 2021-11-23 ENCOUNTER — Telehealth: Payer: No Typology Code available for payment source | Admitting: Family Medicine

## 2021-11-23 ENCOUNTER — Other Ambulatory Visit (HOSPITAL_COMMUNITY): Payer: Self-pay

## 2021-11-23 DIAGNOSIS — B001 Herpesviral vesicular dermatitis: Secondary | ICD-10-CM | POA: Diagnosis not present

## 2021-11-23 MED ORDER — VALACYCLOVIR HCL 1 G PO TABS
2000.0000 mg | ORAL_TABLET | Freq: Two times a day (BID) | ORAL | 0 refills | Status: AC
Start: 2021-11-23 — End: 2021-11-28
  Filled 2021-11-23: qty 4, 1d supply, fill #0

## 2021-11-23 NOTE — Progress Notes (Signed)

## 2021-11-25 ENCOUNTER — Other Ambulatory Visit: Payer: Self-pay | Admitting: Physician Assistant

## 2021-11-25 ENCOUNTER — Other Ambulatory Visit (HOSPITAL_COMMUNITY): Payer: Self-pay

## 2021-11-26 ENCOUNTER — Other Ambulatory Visit (HOSPITAL_COMMUNITY): Payer: Self-pay

## 2021-11-27 ENCOUNTER — Other Ambulatory Visit (HOSPITAL_COMMUNITY): Payer: Self-pay

## 2021-11-29 ENCOUNTER — Telehealth: Payer: No Typology Code available for payment source | Admitting: Family

## 2021-11-29 DIAGNOSIS — L309 Dermatitis, unspecified: Secondary | ICD-10-CM

## 2021-11-29 MED ORDER — TRIAMCINOLONE ACETONIDE 0.5 % EX OINT
1.0000 "application " | TOPICAL_OINTMENT | Freq: Two times a day (BID) | CUTANEOUS | 0 refills | Status: DC
  Filled 2021-11-29: qty 60, 30d supply, fill #0

## 2021-11-29 NOTE — Progress Notes (Signed)

## 2021-11-30 ENCOUNTER — Other Ambulatory Visit (HOSPITAL_COMMUNITY): Payer: Self-pay

## 2021-12-03 ENCOUNTER — Telehealth: Payer: No Typology Code available for payment source | Admitting: Physician Assistant

## 2021-12-03 ENCOUNTER — Other Ambulatory Visit (HOSPITAL_COMMUNITY): Payer: Self-pay

## 2021-12-03 DIAGNOSIS — H1010 Acute atopic conjunctivitis, unspecified eye: Secondary | ICD-10-CM | POA: Diagnosis not present

## 2021-12-03 DIAGNOSIS — N898 Other specified noninflammatory disorders of vagina: Secondary | ICD-10-CM

## 2021-12-03 MED ORDER — FLUCONAZOLE 150 MG PO TABS
150.0000 mg | ORAL_TABLET | Freq: Once | ORAL | 0 refills | Status: AC
  Filled 2021-12-03: qty 1, 1d supply, fill #0

## 2021-12-03 MED ORDER — AZELASTINE HCL 0.05 % OP SOLN
1.0000 [drp] | Freq: Two times a day (BID) | OPHTHALMIC | 0 refills | Status: DC
  Filled 2021-12-03: qty 6, 30d supply, fill #0

## 2021-12-03 NOTE — Progress Notes (Signed)
E-Visit for [employer]   We are sorry that you are not feeling well.  Here is how we plan to help!  Based on what you have shared with me it looks like you have conjunctivitis.  Conjunctivitis is a common inflammatory or infectious condition of the eye that is often referred to as "pink eye".  In most cases it is contagious (viral or bacterial). However, not all conjunctivitis requires antibiotics (ex. Allergic).  We have made appropriate suggestions for you based upon your presentation.  I have prescribed Optivar eye drops for you to use as directed to calm symptoms down.   Pink eye can be highly contagious.  It is typically spread through direct contact with secretions, or contaminated objects or surfaces that one may have touched.  Strict handwashing is suggested with soap and water is urged.  If not available, use alcohol based had sanitizer.  Avoid unnecessary touching of the eye.  If you wear contact lenses, you will need to refrain from wearing them until you see no [place] discharge from the eye for at least 24 hours after being on medication.  You should see symptom improvement in 1-2 days after starting the medication regimen.  Call us if symptoms are not improved in 1-2 days.  Home Care: Wash your hands often! Do not wear your contacts until you complete your treatment plan. Avoid sharing towels, bed linen, personal items with a person who has pink eye. See attention for anyone in your home with similar symptoms.  Get Help Right Away If: Your symptoms do not improve. You develop blurred or loss of vision. Your symptoms worsen (increased discharge, pain or redness)   Thank you for choosing an e-visit.  Your e-visit answers were reviewed by a board certified advanced clinical practitioner to complete your personal care plan. Depending upon the condition, your plan could have included both over the counter or prescription medications.  Please review your pharmacy choice. Make sure the  pharmacy is open so you can pick up prescription now. If there is a problem, you may contact your provider through [employer] and have the prescription routed to another pharmacy.  Your safety is important to [place]. If you have drug allergies check your prescription carefully.   For the next 24 hours you can use MyChart to ask questions about today's visit, request a non-urgent call back, or ask for a work or school excuse. You will get an email in the next two days asking about your experience. I hope that your e-visit has been valuable and will speed your recovery.

## 2021-12-03 NOTE — Progress Notes (Signed)
I have spent 5 minutes in review of e-visit questionnaire, review and updating patient chart, medical decision making and response to patient.   Loyce Flaming Cody Liona Wengert, PA-C    

## 2021-12-03 NOTE — Progress Notes (Signed)

## 2021-12-03 NOTE — Progress Notes (Signed)
I have spent 5 minutes in review of e-visit questionnaire, review and updating patient chart, medical decision making and response to patient.   Kirah Stice Cody Karsen Nakanishi, PA-C    

## 2021-12-26 ENCOUNTER — Encounter (HOSPITAL_COMMUNITY): Payer: Self-pay

## 2021-12-26 ENCOUNTER — Ambulatory Visit (HOSPITAL_COMMUNITY)
Admission: EM | Admit: 2021-12-26 | Discharge: 2021-12-26 | Disposition: A | Discharge status: Home / Self Care | Payer: No Typology Code available for payment source | Attending: Emergency Medicine | Admitting: Emergency Medicine

## 2021-12-26 DIAGNOSIS — J069 Acute upper respiratory infection, unspecified: Secondary | ICD-10-CM | POA: Diagnosis not present

## 2021-12-26 MED ORDER — ALBUTEROL SULFATE HFA 108 (90 BASE) MCG/ACT IN AERS: 2.0000 | INHALATION_SPRAY | RESPIRATORY_TRACT | 0 refills | Status: DC | PRN

## 2021-12-26 MED ORDER — PREDNISONE 20 MG PO TABS: 40.0000 mg | ORAL_TABLET | Freq: Every day | ORAL | 0 refills | Status: DC

## 2021-12-26 NOTE — ED Triage Notes (Signed)
Pt presents with c/o a cough that started yesterday.   States she has chest tightness from coughing and SOB x this morning.   States she took a Medical illustrator today.

## 2021-12-26 NOTE — Discharge Instructions (Addendum)
Declined [facility]

## 2021-12-26 NOTE — ED Provider Notes (Addendum)
[facility]    CSN: [id number] Arrival date & time: 12/26/21  1444      History   Chief Complaint Chief Complaint  Patient presents with   Cough    Appt   Chest Pain    tightness   Shortness of Breath    HPI Rachael Sherman is a 35 y.o. female.   Patient presents with intermittent nonproductive cough for 4 days, 1 day ago began to experience chest tightness and  shortness of breath after coughing spells.  Has attempted use of over-the-counter cough medicine, Promethazine DM, Tessalon which has only been temporarily helpful.  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  History of hypertension.  Denies fever, chills, body aches, wheezing, nasal congestion, rhinorrhea, sore throat, abdominal pain, nausea, vomiting, diarrhea, headaches.   Past Medical History:  Diagnosis Date   Endometriosis    Hypertension    [employer]reesia 11/25/2020    Patient Active Problem List   Diagnosis Date Noted   Vitamin D deficiency 12/08/2020   Migraine 11/26/2020   Essential hypertension 01/31/2020   Atypical chest pain 01/31/2020    Past Surgical History:  Procedure Laterality Date   BREAST EXCISIONAL BIOPSY Left    fibroadenoma   BREAST SURGERY     tumor (benign) left breast removal    CESAREAN SECTION     TUBAL LIGATION N/A    Phreesia 11/25/2020    OB History   No obstetric history on file.      Home Medications    Prior to Admission medications   Medication Sig Start Date End Date Taking? Authorizing Provider  amitriptyline (ELAVIL) 10 MG tablet TAKE 1 TABLET (10 MG TOTAL) BY MOUTH AT BEDTIME. 11/25/20 11/25/21  Mayers, Cari S, PA-C  amLODipine (NORVASC) 5 MG tablet TAKE 1 TABLET (5 MG TOTAL) BY MOUTH DAILY. 11/25/20 11/25/21  Mayers, Cari S, PA-C  amLODipine (NORVASC) 5 MG tablet TAKE 1 TABLET BY MOUTH DAILY 08/14/20 08/14/21  Mardella Layman, MD  azelastine (OPTIVAR) 0.05 % ophthalmic solution Apply 1 drop to eye 2 (two) times daily. 12/03/21   Waldon Merl, PA-C  HYDROcodone-acetaminophen (NORCO/VICODIN) 5-325 MG tablet Take 1 tablet by mouth every 6 (six) hours as needed for severe pain. 03/15/21   Wallis Bamberg, PA-C  HYDROcodone-acetaminophen (NORCO/VICODIN) 5-325 MG tablet Take 1 tablet by mouth every 6 (six) hours as needed. 04/13/21     hydrOXYzine (ATARAX/VISTARIL) 25 MG tablet Take 1 tablet (25 mg total) by mouth 3 (three) times daily as needed. 08/08/19   Claiborne Rigg, NP  ibuprofen (ADVIL) 600 MG tablet Take 1 tablet by mouth every 6 to 8 hours as needed. 04/13/21     ondansetron (ZOFRAN ODT) 4 MG disintegrating tablet Take 1 tablet (4 mg total) by mouth every 8 (eight) hours as needed for nausea or vomiting. 09/10/21   Waldon Merl, PA-C  SUMAtriptan (IMITREX) 50 MG tablet Take 1 tablet at the start of a migraine. Repeat in 2 hours if needed. Do not take more than 2 tablets in a day. 03/15/21   Wallis Bamberg, PA-C  traZODone (DESYREL) 100 MG tablet Take 1 tablet (100 mg total) by mouth at bedtime. 07/18/19   Claiborne Rigg, NP  triamcinolone cream (KENALOG) 0.1 % Apply 1 application topically 2 (two) times daily. 06/15/21   Couture, Cortni S, PA-C  triamcinolone ointment (KENALOG) 0.5 % Apply 1 application topically 2 (two) times daily. 11/29/21   Junie Spencer, FNP  Ubrogepant 50 MG  TABS TAKE 1 TABLET AT THE ONSET OF MIGRAINE. CAN REPEAT IN 2 HOURS IF STILL HAVING HEADACHE. DO NOT EXCEED 200MG 'S IN 1 DAY 03/16/21 03/16/22  03/18/22, MD    Family History Family History  Problem Relation Age of Onset   Healthy Mother    Healthy Father     Social History Social History   Tobacco Use   Smoking status: Never   Smokeless tobacco: Never  Vaping Use   Vaping Use: Never used  Substance Use Topics   Alcohol use: Yes    Comment: socially   Drug use: No     Allergies   Patient has no known allergies.   Review of Systems Review of Systems  Constitutional: Negative.   HENT: Negative.    Respiratory:  Positive  for cough, chest tightness and shortness of breath. Negative for apnea, choking, wheezing and stridor.   Cardiovascular:  Negative for chest pain, palpitations and leg swelling.  Gastrointestinal: Negative.   Skin: Negative.   Neurological: Negative.     Physical Exam Triage Vital Signs ED Triage Vitals  Enc Vitals Group     BP 12/26/21 1541 (!) 138/95     Pulse Rate 12/26/21 1541 81     Resp 12/26/21 1541 17     Temp 12/26/21 1541 98.2 F (36.8 C)     Temp Source 12/26/21 1541 Oral     SpO2 12/26/21 1541 100 %     Weight --      Height --      Head Circumference --      Peak Flow --      Pain Score 12/26/21 1540 5     Pain Loc --      Pain Edu? --      Excl. in GC? --    No data found.  Updated Vital Signs BP (!) 138/95 (BP Location: Right Arm)    Pulse 81    Temp 98.2 F (36.8 C) (Oral)    Resp 17    LMP 12/25/2021 (Exact Date)    SpO2 100%   Visual Acuity Right Eye Distance:   Left Eye Distance:   Bilateral Distance:    Right Eye Near:   Left Eye Near:    Bilateral Near:     Physical Exam Constitutional:      Appearance: Normal appearance.  HENT:     Head: Normocephalic.     Right Ear: Tympanic membrane, ear canal and external ear normal.     Left Ear: Tympanic membrane, ear canal and external ear normal.  Eyes:     Extraocular Movements: Extraocular movements intact.  Cardiovascular:     Rate and Rhythm: Normal rate and regular rhythm.     Pulses: Normal pulses.     Heart sounds: Normal heart sounds.  Pulmonary:     Effort: Pulmonary effort is normal.     Breath sounds: Normal breath sounds.     Comments: Dry cough witnessed during exam Skin:    General: Skin is warm and dry.  Neurological:     Mental Status: She is alert and oriented to person, place, and time. Mental status is at baseline.  Psychiatric:        Mood and Affect: Mood normal.        Behavior: Behavior normal.     UC Treatments / Results  Labs (all labs ordered are listed, but  only abnormal results are displayed) Labs Reviewed - No data to display  EKG  Radiology No results found.  Procedures Procedures (including critical care time)  Medications Ordered in UC Medications - No data to display  Initial Impression / Assessment and Plan / UC Course  I have reviewed the triage vital signs and the nursing notes.  Pertinent labs & imaging results that were available during my care of the patient were reviewed by me and considered in my medical decision making (see chart for details).  Viral URI with cough  Viral testing at this time as it will not change course of treatment plan, unfortunately patient has eligibility for Tamiflu, as a young healthy adult does not qualify for COVID antivirals, will defer imaging at this time as lungs are clear on exam and O2 saturation 100% on room air, discussed findings with patient, will move forward managing conservatively with follow-up in urgent care with primary care doctor as needed, prescribed albuterol inhaler and 5-day prednisone course, may continue use of prescription and over-the-counter cough medications and home remedies as needed Final Clinical Impressions(s) / UC Diagnoses   Final diagnoses:  None   Discharge Instructions   None    ED Prescriptions   None    PDMP not reviewed this encounter.   Genesis, Novosad, NP 12/29/21 1554    Valinda Hoar, NP 12/29/21 1554

## 2022-01-09 ENCOUNTER — Ambulatory Visit (HOSPITAL_COMMUNITY)
Admission: EM | Admit: 2022-01-09 | Discharge: 2022-01-09 | Disposition: A | Discharge status: Home / Self Care | Payer: No Typology Code available for payment source | Attending: Family Medicine | Admitting: Family Medicine

## 2022-01-09 ENCOUNTER — Encounter (HOSPITAL_COMMUNITY): Payer: Self-pay | Admitting: *Deleted

## 2022-01-09 ENCOUNTER — Other Ambulatory Visit: Payer: Self-pay

## 2022-01-09 DIAGNOSIS — R110 Nausea: Secondary | ICD-10-CM | POA: Diagnosis not present

## 2022-01-09 DIAGNOSIS — R109 Unspecified abdominal pain: Secondary | ICD-10-CM | POA: Insufficient documentation

## 2022-01-09 LAB — CBC WITH DIFFERENTIAL/PLATELET
Abs Immature Granulocytes: 0.01 10*3/uL (ref 0.00–0.07)
Basophils Absolute: 0 10*3/uL (ref 0.0–0.1)
Basophils Relative: 1 %
Eosinophils Absolute: 0.1 10*3/uL (ref 0.0–0.5)
Eosinophils Relative: 2 %
HCT: 39.2 % (ref 36.0–46.0)
Hemoglobin: 12.6 g/dL (ref 12.0–15.0)
Immature Granulocytes: 0 %
Lymphocytes Relative: 31 %
Lymphs Abs: 1.7 10*3/uL (ref 0.7–4.0)
MCH: 26.2 pg (ref 26.0–34.0)
MCHC: 32.1 g/dL (ref 30.0–36.0)
MCV: 81.5 fL (ref 80.0–100.0)
Monocytes Absolute: 0.5 10*3/uL (ref 0.1–1.0)
Monocytes Relative: 10 %
Neutro Abs: 3.1 10*3/uL (ref 1.7–7.7)
Neutrophils Relative %: 56 %
Platelets: 275 10*3/uL (ref 150–400)
RBC: 4.81 MIL/uL (ref 3.87–5.11)
RDW: 12.7 % (ref 11.5–15.5)
WBC: 5.5 10*3/uL (ref 4.0–10.5)
nRBC: 0 % (ref 0.0–0.2)

## 2022-01-09 LAB — POCT URINALYSIS DIPSTICK, ED / UC
Glucose, UA: NEGATIVE mg/dL
Hgb urine dipstick: NEGATIVE
Leukocytes,Ua: NEGATIVE
Nitrite: NEGATIVE
Protein, ur: NEGATIVE mg/dL
Specific Gravity, Urine: >=1.03 (ref 1.005–1.030)
Urobilinogen, UA: 1 mg/dL (ref 0.0–1.0)
pH: 5.5 (ref 5.0–8.0)

## 2022-01-09 LAB — BASIC METABOLIC PANEL
Anion gap: 4 — ABNORMAL LOW (ref 5–15)
BUN: 11 mg/dL (ref 6–20)
CO2: 27 mmol/L (ref 22–32)
Calcium: 9.3 mg/dL (ref 8.9–10.3)
Chloride: 104 mmol/L (ref 98–111)
Creatinine, Ser: 0.87 mg/dL (ref 0.44–1.00)
GFR, Estimated: >60 mL/min (ref >60)
Glucose, Bld: 83 mg/dL (ref 70–99)
Potassium: 3.7 mmol/L (ref 3.5–5.1)
Sodium: 135 mmol/L (ref 135–145)

## 2022-01-09 LAB — POC URINE PREG, ED: Preg Test, Ur: NEGATIVE

## 2022-01-09 MED ORDER — ONDANSETRON 4 MG PO TBDP: 4.0000 mg | ORAL_TABLET | Freq: Three times a day (TID) | ORAL | 0 refills | Status: DC | PRN

## 2022-01-09 MED ORDER — KETOROLAC TROMETHAMINE 30 MG/ML IJ SOLN
INTRAMUSCULAR | Status: AC
  Filled 2022-01-09: qty 1

## 2022-01-09 MED ORDER — KETOROLAC TROMETHAMINE 30 MG/ML IJ SOLN
30.0000 mg | Freq: Once | INTRAMUSCULAR | Status: AC
  Administered 2022-01-09: 30 mg via INTRAVENOUS

## 2022-01-09 MED ORDER — HYDROCODONE-ACETAMINOPHEN 5-325 MG PO TABS: 1.0000 | ORAL_TABLET | Freq: Four times a day (QID) | ORAL | 0 refills | Status: DC | PRN

## 2022-01-09 MED ORDER — ONDANSETRON 4 MG PO TBDP
4.0000 mg | ORAL_TABLET | Freq: Once | ORAL | Status: AC
  Administered 2022-01-09: 4 mg via ORAL

## 2022-01-09 MED ORDER — SODIUM CHLORIDE 0.9 % IV BOLUS
1000.0000 mL | Freq: Once | INTRAVENOUS | Status: AC
  Administered 2022-01-09: 1000 mL via INTRAVENOUS

## 2022-01-09 MED ORDER — ONDANSETRON 4 MG PO TBDP
ORAL_TABLET | ORAL | Status: AC
  Filled 2022-01-09: qty 1

## 2022-01-09 NOTE — Discharge Instructions (Addendum)
Be aware, you have been prescribed pain medications that may cause drowsiness. While taking this medication, do not take any other medications containing acetaminophen (Tylenol). Do not combine with alcohol or other illicit drugs. Please do not drive, operate heavy machinery, or take part in activities that require making important decisions while on this medication as your judgement may be clouded.  Please do your best to ensure adequate fluid intake in order to avoid dehydration. If you find that you are unable to tolerate drinking fluids regularly please proceed to the Emergency Department for evaluation.

## 2022-01-09 NOTE — ED Triage Notes (Signed)
Pt reports bil flank pain with nausea that started last night.

## 2022-01-11 NOTE — ED Provider Notes (Signed)
[facility]    ASSESSMENT & PLAN:  1. Right flank pain   2. Left flank pain   3. Nausea    Some improvement after IVF. Benign abd exam. U/A without signs of infection. UPT negative. No hematuria but symptoms consistent with previous kidney stone that has passed.  Meds ordered this encounter  Medications   sodium chloride 0.9 % bolus 1,000 mL   ondansetron (ZOFRAN-ODT) disintegrating tablet 4 mg   ketorolac (TORADOL) 30 MG/ML injection 30 mg   HYDROcodone-acetaminophen (NORCO/VICODIN) 5-325 MG tablet    Sig: Take 1 tablet by mouth every 6 (six) hours as needed for moderate pain or severe pain.    Dispense:  8 tablet    Refill:  0   ondansetron (ZOFRAN-ODT) 4 MG disintegrating tablet    Sig: Take 1 tablet (4 mg total) by mouth every 8 (eight) hours as needed for nausea or vomiting.    Dispense:  15 tablet    Refill:  0   No signs of pyelonephritis. Discussed. CBC/BMP WNL. D/C to home. Will follow up with her PCP or here if not showing improvement over the next 48 hours, sooner if needed.  Outlined signs and symptoms indicating need for more acute intervention. Patient verbalized understanding. After Visit Summary given.  SUBJECTIVE:  Rachael Sherman is a 36 y.o. female who complains of bilat (R>L) flank pain; assoc nausea without emesis; onset abrupt; last evening. Reports feels like kidney stone she has had in the past. Afebrile. No abd pain. Ambulatory. OTC analgesics without help.  LMP: Patient's last menstrual period was 12/25/2021 (exact date).  OBJECTIVE:  Vitals:   01/09/22 1430 01/09/22 1747  BP: (!) 138/101 (!) 149/91  Pulse: 80 78  Resp: 18   Temp: 98.2 F (36.8 C)   TempSrc:  Oral  SpO2: 100%    General appearance: alert; no distress HENT: oropharynx: moist Lungs: unlabored respirations Abdomen: soft, non-tender; bowel sounds normal; no masses or organomegaly; no guarding or rebound tenderness Back: mild R CVA tenderness GU:  deferred Extremities: no edema; symmetrical with no gross deformities Skin: warm and dry Neurologic: normal gait Psychological: alert and cooperative; normal mood and affect  Labs Reviewed  BASIC METABOLIC PANEL - Abnormal; Notable for the following components:      Result Value   Anion gap 4 (*)    All other components within normal limits  POCT URINALYSIS DIPSTICK, ED / UC - Abnormal; Notable for the following components:   Bilirubin Urine SMALL (*)    Ketones, ur TRACE (*)    All other components within normal limits  CBC WITH DIFFERENTIAL/PLATELET  POC URINE PREG, ED    No Known Allergies  Past Medical History:  Diagnosis Date   Endometriosis    Hypertension    [employer]reesia 11/25/2020   Social History   Socioeconomic History   Marital status: Single    Spouse name: Not on file   Number of children: Not on file   Years of education: Not on file   Highest education level: Not on file  Occupational History   Not on file  Tobacco Use   Smoking status: Never   Smokeless tobacco: Never  Vaping Use   Vaping Use: Never used  Substance and Sexual Activity   Alcohol use: Yes    Comment: socially   Drug use: No   Sexual activity: Not Currently  Other Topics Concern   Not on file  Social History Narrative   Not on file  Social Determinants of Health   Financial Resource Strain: Not on file  Food Insecurity: Not on file  Transportation Needs: Not on file  Physical Activity: Not on file  Stress: Not on file  Social Connections: Not on file  Intimate Partner Violence: Not on file   Family History  Problem Relation Age of Onset   Healthy Mother    Healthy Father         Vanessa Kick, MD 01/11/22 [phone]

## 2022-01-15 ENCOUNTER — Other Ambulatory Visit: Payer: Self-pay | Admitting: Internal Medicine

## 2022-01-15 ENCOUNTER — Telehealth (HOSPITAL_COMMUNITY): Payer: Self-pay | Admitting: Family Medicine

## 2022-01-15 ENCOUNTER — Other Ambulatory Visit (HOSPITAL_COMMUNITY): Payer: Self-pay

## 2022-01-15 DIAGNOSIS — E559 Vitamin D deficiency, unspecified: Secondary | ICD-10-CM

## 2022-01-15 DIAGNOSIS — I10 Essential (primary) hypertension: Secondary | ICD-10-CM

## 2022-01-15 MED ORDER — AMLODIPINE BESYLATE 5 MG PO TABS
5.0000 mg | ORAL_TABLET | Freq: Every day | ORAL | 0 refills | Status: DC
  Filled 2022-01-15: qty 90, 90d supply, fill #0

## 2022-01-15 NOTE — Telephone Encounter (Signed)
Rx sent for amlodipne for pt's htn [facility]

## 2022-02-23 ENCOUNTER — Telehealth: Payer: No Typology Code available for payment source | Admitting: Physician Assistant

## 2022-02-23 ENCOUNTER — Other Ambulatory Visit (HOSPITAL_COMMUNITY): Payer: Self-pay

## 2022-02-23 DIAGNOSIS — R3989 Other symptoms and signs involving the genitourinary system: Secondary | ICD-10-CM

## 2022-02-23 MED ORDER — CEPHALEXIN 500 MG PO CAPS
500.0000 mg | ORAL_CAPSULE | Freq: Two times a day (BID) | ORAL | 0 refills | Status: AC
  Filled 2022-02-23: qty 14, 7d supply, fill #0

## 2022-02-23 NOTE — Progress Notes (Signed)

## 2022-02-23 NOTE — Progress Notes (Signed)
I have spent 5 minutes in review of e-visit questionnaire, review and updating patient chart, medical decision making and response to patient.   Izeyah Deike Cody Ileene Allie, PA-C    

## 2022-03-15 ENCOUNTER — Encounter: Payer: Self-pay | Admitting: Registered Nurse

## 2022-03-15 ENCOUNTER — Ambulatory Visit (INDEPENDENT_AMBULATORY_CARE_PROVIDER_SITE_OTHER): Payer: No Typology Code available for payment source | Admitting: Registered Nurse

## 2022-03-15 ENCOUNTER — Other Ambulatory Visit (HOSPITAL_COMMUNITY)
Admission: RE | Admit: 2022-03-15 | Discharge: 2022-03-15 | Disposition: A | Discharge status: Home / Self Care | Payer: No Typology Code available for payment source | Source: Ambulatory Visit | Attending: Registered Nurse | Admitting: Registered Nurse

## 2022-03-15 ENCOUNTER — Other Ambulatory Visit (HOSPITAL_COMMUNITY): Payer: Self-pay

## 2022-03-15 VITALS — BP 149/100 | HR 66 | Temp 97.7°F | Resp 18 | Ht 62.0 in | Wt 105.7 lb

## 2022-03-15 DIAGNOSIS — I10 Essential (primary) hypertension: Secondary | ICD-10-CM | POA: Diagnosis present

## 2022-03-15 DIAGNOSIS — Z113 Encounter for screening for infections with a predominantly sexual mode of transmission: Secondary | ICD-10-CM

## 2022-03-15 DIAGNOSIS — Z130 Encounter for screening for diseases of the blood and blood-forming organs and certain disorders involving the immune mechanism: Secondary | ICD-10-CM

## 2022-03-15 DIAGNOSIS — Z13228 Encounter for screening for other metabolic disorders: Secondary | ICD-10-CM

## 2022-03-15 DIAGNOSIS — N3000 Acute cystitis without hematuria: Secondary | ICD-10-CM | POA: Diagnosis not present

## 2022-03-15 DIAGNOSIS — Z1329 Encounter for screening for other suspected endocrine disorder: Secondary | ICD-10-CM

## 2022-03-15 DIAGNOSIS — Z13220 Encounter for screening for lipoid disorders: Secondary | ICD-10-CM

## 2022-03-15 LAB — COMPREHENSIVE METABOLIC PANEL
ALT: 12 U/L (ref 0–35)
AST: 14 U/L (ref 0–37)
Albumin: 4.3 g/dL (ref 3.5–5.2)
Alkaline Phosphatase: 54 U/L (ref 39–117)
BUN: 13 mg/dL (ref 6–23)
CO2: 28 mEq/L (ref 19–32)
Calcium: 9.2 mg/dL (ref 8.4–10.5)
Chloride: 101 mEq/L (ref 96–112)
Creatinine, Ser: 0.7 mg/dL (ref 0.40–1.20)
GFR: 112.72 mL/min (ref >60.00)
Glucose, Bld: 69 mg/dL — ABNORMAL LOW (ref 70–99)
Potassium: 3.7 mEq/L (ref 3.5–5.1)
Sodium: 136 mEq/L (ref 135–145)
Total Bilirubin: 0.9 mg/dL (ref 0.2–1.2)
Total Protein: 7.7 g/dL (ref 6.0–8.3)

## 2022-03-15 LAB — CBC WITH DIFFERENTIAL/PLATELET
Basophils Absolute: 0 10*3/uL (ref 0.0–0.1)
Basophils Relative: 1 % (ref 0.0–3.0)
Eosinophils Absolute: 0.1 10*3/uL (ref 0.0–0.7)
Eosinophils Relative: 3.2 % (ref 0.0–5.0)
HCT: 37.3 % (ref 36.0–46.0)
Hemoglobin: 11.9 g/dL — ABNORMAL LOW (ref 12.0–15.0)
Lymphocytes Relative: 35.8 % (ref 12.0–46.0)
Lymphs Abs: 1.5 10*3/uL (ref 0.7–4.0)
MCHC: 31.9 g/dL (ref 30.0–36.0)
MCV: 81.7 fl (ref 78.0–100.0)
Monocytes Absolute: 0.5 10*3/uL (ref 0.1–1.0)
Monocytes Relative: 12.2 % — ABNORMAL HIGH (ref 3.0–12.0)
Neutro Abs: 2.1 10*3/uL (ref 1.4–7.7)
Neutrophils Relative %: 47.8 % (ref 43.0–77.0)
Platelets: 270 10*3/uL (ref 150.0–400.0)
RBC: 4.57 Mil/uL (ref 3.87–5.11)
RDW: 13.8 % (ref 11.5–15.5)
WBC: 4.3 10*3/uL (ref 4.0–10.5)

## 2022-03-15 LAB — HEMOGLOBIN A1C: Hgb A1c MFr Bld: 5.4 % (ref 4.6–6.5)

## 2022-03-15 LAB — LIPID PANEL
Cholesterol: 177 mg/dL (ref 0–200)
HDL: 80 mg/dL (ref >39.00)
LDL Cholesterol: 87 mg/dL (ref 0–99)
NonHDL: 97.45
Total CHOL/HDL Ratio: 2
Triglycerides: 51 mg/dL (ref 0.0–149.0)
VLDL: 10.2 mg/dL (ref 0.0–40.0)

## 2022-03-15 LAB — TSH: TSH: 1.67 u[IU]/mL (ref 0.35–5.50)

## 2022-03-15 MED ORDER — AMLODIPINE-OLMESARTAN 10-20 MG PO TABS
1.0000 | ORAL_TABLET | Freq: Every day | ORAL | 0 refills | Status: DC
  Filled 2022-03-15: qty 90, 90d supply, fill #0

## 2022-03-15 NOTE — Progress Notes (Signed)
? ?New Patient Office Visit ? ?Subjective:  ?Patient ID: Rachael Sherman, female    DOB: 03/06/1986  Age: 36 y.o. MRN: [id number] ? ?CC:  ?Chief Complaint  ?Patient presents with  ? New Patient (Initial Visit)  ?  Patient states she is here to establish care and discuss Hypertension  ? ? ?HPI ?Rachael Sherman presents for visit to est care ? ?Histories reviewed and updated with patient.  ? ?Hypertension: ?Patient Currently taking: amlodipine 5mg  po qd ?Good effect. No AEs. ?Denies CV symptoms including: chest pain, shob, doe, headache, visual changes, fatigue, claudication, and dependent edema.  ? ?Previous readings and labs: ?BP Readings from Last 3 Encounters:  ?03/15/22 (!) 149/100  ?01/09/22 (!) 149/91  ?12/26/21 (!) 138/95  ? ?Lab Results  ?Component Value Date  ? CREATININE 0.87 01/09/2022  ? ? ?Cystitis ?Recently tx with keflex ?Still some dysuria ?Wants to recheck ? ?STI screen ?No known exposure ?No vaginal symptoms ?Pelvic pain r/t endometriosis stable. ? ?Otherwise no concerns.  ? ?Past Medical History:  ?Diagnosis Date  ? Allergy   ? Anxiety   ? Endometriosis   ? Hypertension   ? [employer]reesia 11/25/2020  ? ? ?Past Surgical History:  ?Procedure Laterality Date  ? BREAST EXCISIONAL BIOPSY Left   ? fibroadenoma  ? BREAST SURGERY    ? tumor (benign) left breast removal   ? CESAREAN SECTION    ? 2x  ? laproscopic    ? endometriosis  ? TUBAL LIGATION N/A   ? [employer]reesia 11/25/2020  ? ? ?Family History  ?Adopted: Yes  ?Problem Relation Age of Onset  ? Hypertension Mother   ? Healthy Mother   ? Drug abuse Mother   ? Hypertension Father   ? Healthy Father   ? Anxiety disorder Brother   ? ? ?Social History  ? ?Socioeconomic History  ? Marital status: Single  ?  Spouse name: Not on file  ? Number of children: 2  ? Years of education: Not on file  ? Highest education level: Not on file  ?Occupational History  ? Not on file  ?Tobacco Use  ? Smoking status: Never  ? Smokeless tobacco: Never  ?Vaping Use  ? Vaping Use: Never used   ?Substance and Sexual Activity  ? Alcohol use: Yes  ?  Comment: socially  ? Drug use: No  ? Sexual activity: Yes  ?  Birth control/protection: Surgical  ?Other Topics Concern  ? Not on file  ?Social History Narrative  ? Not on file  ? ?Social Determinants of Health  ? ?Financial Resource Strain: Not on file  ?Food Insecurity: Not on file  ?Transportation Needs: Not on file  ?Physical Activity: Not on file  ?Stress: Not on file  ?Social Connections: Not on file  ?Intimate Partner Violence: Not on file  ? ? ?ROS ?Review of Systems ? ?Objective:  ? ?Today's Vitals: BP (!) 149/100   Pulse 66   Temp 97.7 ?F (36.5 ?C) (Temporal)   Resp 18   Ht 5\' 2"  (1.575 m)   Wt 105 lb 11.2 oz (47.9 kg)   LMP 03/12/2022   SpO2 100%   BMI 19.33 kg/m?  ? ?Physical Exam ? ?Assessment & Plan:  ? ?Problem List Items Addressed This Visit   ?None ?Visit Diagnoses   ? ? Primary hypertension    -  Primary  ? Relevant Medications  ? amlodipine-olmesartan (AZOR) 10-20 MG tablet  ? Other Relevant Orders  ? CBC with Differential/Platelet  ? Comprehensive metabolic panel  ?  Urine cytology ancillary only([place])  ? RPR  ? HIV antibody (with reflex)  ? Routine screening for STI (sexually transmitted infection)      ? Acute cystitis without hematuria      ? Relevant Orders  ? Urine Culture  ? Screening for endocrine, metabolic and immunity disorder      ? Relevant Orders  ? Hemoglobin A1c  ? TSH  ? Lipid screening      ? Relevant Orders  ? Lipid panel  ? ?  ? ? ?Outpatient Encounter Medications as of 03/15/2022  ?Medication Sig  ? albuterol (VENTOLIN HFA) 108 (90 Base) MCG/ACT inhaler Inhale 2 puffs into the lungs every 4 (four) hours as needed for wheezing or shortness of breath.  ? amlodipine-olmesartan (AZOR) 10-20 MG tablet Take 1 tablet by mouth daily.  ? hydrOXYzine (ATARAX/VISTARIL) 25 MG tablet Take 1 tablet (25 mg total) by mouth 3 (three) times daily as needed.  ? ondansetron (ZOFRAN-ODT) 4 MG disintegrating tablet Take 1 tablet  (4 mg total) by mouth every 8 (eight) hours as needed for nausea or vomiting.  ? Ubrogepant 50 MG TABS TAKE 1 TABLET AT THE ONSET OF MIGRAINE. CAN REPEAT IN 2 HOURS IF STILL HAVING HEADACHE. DO NOT EXCEED 200MG 'S IN 1 DAY  ? [DISCONTINUED] amLODipine (NORVASC) 5 MG tablet Take 1 tablet (5 mg total) by mouth daily.  ? [DISCONTINUED] azelastine (OPTIVAR) 0.05 % ophthalmic solution Apply 1 drop to eye 2 (two) times daily.  ? [DISCONTINUED] ibuprofen (ADVIL) 600 MG tablet Take 1 tablet by mouth every 6 to 8 hours as needed.  ? [DISCONTINUED] SUMAtriptan (IMITREX) 50 MG tablet Take 1 tablet at the start of a migraine. Repeat in 2 hours if needed. Do not take more than 2 tablets in a day.  ? [DISCONTINUED] traZODone (DESYREL) 100 MG tablet Take 1 tablet (100 mg total) by mouth at bedtime.  ? [DISCONTINUED] triamcinolone cream (KENALOG) 0.1 % Apply 1 application topically 2 (two) times daily.  ? [DISCONTINUED] triamcinolone ointment (KENALOG) 0.5 % Apply 1 application topically 2 (two) times daily.  ? amitriptyline (ELAVIL) 10 MG tablet TAKE 1 TABLET (10 MG TOTAL) BY MOUTH AT BEDTIME.  ? [DISCONTINUED] amLODipine (NORVASC) 5 MG tablet TAKE 1 TABLET BY MOUTH DAILY  ? [DISCONTINUED] HYDROcodone-acetaminophen (NORCO/VICODIN) 5-325 MG tablet Take 1 tablet by mouth every 6 (six) hours as needed for moderate pain or severe pain. (Patient not taking: Reported on 03/15/2022)  ? [DISCONTINUED] predniSONE (DELTASONE) 20 MG tablet Take 2 tablets (40 mg total) by mouth daily. (Patient not taking: Reported on 03/15/2022)  ? ?No facility-administered encounter medications on file as of 03/15/2022.  ? ? ?Follow-up: Return in about 6 months (around 09/15/2022) for CPE and labs.  ? ?PLAN ?Increase antihypertensive therapy to amlodipine-olmesartan 10-20mg  po qd ?STI screening collected ?Recheck urine culture ?Labs collected. Will follow up with the patient as warranted. ?Return in 6 mo for CPE and labs ?Patient encouraged to call clinic with  any questions, comments, or concerns. ? ?09/17/2022, NP ? ?

## 2022-03-15 NOTE — Patient Instructions (Addendum)
Ms. Panzera -  ? ?Great to meet you ? ?Call with any concerns ? ?Keep checking on BP - send me a message in a few weeks to let me know how it is going. ? ?See you in 6 mo for physical and labs ? ?Thanks, ? ?Rich  ? ? ? ?If you have lab work done today you will be contacted with your lab results within the next 2 weeks.  If you have not heard from [place] then please contact us. The fastest way to get your results is to register for My Chart. ? ? ?IF you received an x-ray today, you will receive an invoice from [facility]. Please contact [facility] at [phone] with questions or concerns regarding your invoice.  ? ?IF you received labwork today, you will receive an invoice from [facility]. Please contact [facility] at [phone] with questions or concerns regarding your invoice.  ? ?Our billing staff will not be able to assist you with questions regarding bills from these companies. ? ?You will be contacted with the lab results as soon as they are available. The fastest way to get your results is to activate your My Chart account. Instructions are located on the last page of this paperwork. If you have not heard from [place] regarding the results in 2 weeks, please contact this office. ?  ? ? ?

## 2022-03-16 LAB — URINE CYTOLOGY ANCILLARY ONLY
Chlamydia: NEGATIVE
Comment: NEGATIVE
Comment: NEGATIVE
Comment: NORMAL
Neisseria Gonorrhea: NEGATIVE
Trichomonas: NEGATIVE

## 2022-03-16 LAB — RPR: RPR Ser Ql: NONREACTIVE

## 2022-03-16 LAB — HIV ANTIBODY (ROUTINE TESTING W REFLEX): HIV 1&2 Ab, 4th Generation: NONREACTIVE

## 2022-03-17 LAB — URINE CULTURE
MICRO NUMBER:: 13156483
SPECIMEN QUALITY:: ADEQUATE

## 2022-05-08 ENCOUNTER — Encounter: Payer: Self-pay | Admitting: Emergency Medicine

## 2022-05-08 ENCOUNTER — Ambulatory Visit
Admission: EM | Admit: 2022-05-08 | Discharge: 2022-05-08 | Disposition: A | Discharge status: Home / Self Care | Payer: No Typology Code available for payment source | Attending: Internal Medicine | Admitting: Internal Medicine

## 2022-05-08 DIAGNOSIS — B9689 Other specified bacterial agents as the cause of diseases classified elsewhere: Secondary | ICD-10-CM | POA: Diagnosis not present

## 2022-05-08 DIAGNOSIS — R509 Fever, unspecified: Secondary | ICD-10-CM | POA: Diagnosis not present

## 2022-05-08 DIAGNOSIS — J038 Acute tonsillitis due to other specified organisms: Secondary | ICD-10-CM | POA: Diagnosis present

## 2022-05-08 LAB — POCT RAPID STREP A (OFFICE): Rapid Strep A Screen: NEGATIVE

## 2022-05-08 MED ORDER — IBUPROFEN 600 MG PO TABS: 600.0000 mg | ORAL_TABLET | Freq: Three times a day (TID) | ORAL | 0 refills | Status: DC | PRN

## 2022-05-08 MED ORDER — FLUCONAZOLE 150 MG PO TABS
ORAL_TABLET | ORAL | 0 refills | Status: DC
Start: 2022-05-08 — End: 2022-08-03

## 2022-05-08 MED ORDER — LIDOCAINE VISCOUS HCL 2 % MT SOLN
15.0000 mL | OROMUCOSAL | 0 refills | Status: DC | PRN
Start: 2022-05-08 — End: 2022-08-03

## 2022-05-08 MED ORDER — ACETAMINOPHEN 325 MG PO TABS
650.0000 mg | ORAL_TABLET | Freq: Once | ORAL | Status: AC
  Administered 2022-05-08: 650 mg via ORAL

## 2022-05-08 MED ORDER — LIDOCAINE VISCOUS HCL 2 % MT SOLN
15.0000 mL | Freq: Once | OROMUCOSAL | Status: AC
  Administered 2022-05-08: 15 mL via OROMUCOSAL

## 2022-05-08 MED ORDER — CEFDINIR 300 MG PO CAPS: 300.0000 mg | ORAL_CAPSULE | Freq: Two times a day (BID) | ORAL | 0 refills | Status: AC

## 2022-05-08 NOTE — Discharge Instructions (Signed)
Your strep test today is negative.  Streptococcal throat culture will be performed per our protocol, please keep in mind that the rapid strep test that we perform here at urgent care only catches 40% of strep throat infections. ?  ?Based on my physical exam findings and the history you provided to me today, I recommend that you begin antibiotics now for presumed strep throat instead of waiting for the strep culture result.  I have sent a prescription to your pharmacy.  After 24 hours of antibiotics, you should begin to feel significantly better.   ?  ?After 24 hours of taking antibiotics, please discard your toothbrush as well as any other oral devices that you are currently using and replace them with new ones to avoid reinfection. ?  ?If your streptococcal throat culture has a negative result but you feel significantly better after taking antibiotics for 24 to 48 hours, I strongly recommend that you finish the full 10-day course.  Bacterial culture tests are only as reliable as the laboratory technician performing them.  Alternately, if your streptococcal throat culture has a negative result and you see no improvement of your symptoms after 24 to 48 hours of antibiotics, please discontinue the antibiotics as they are no longer indicated.  Your throat infection will then be most likely considered viral and will have to resolve on its own If the antibiotics do not make you feel better within the next 24 to 48 hours, it might be worthwhile to test you for mono. ?  ?Once you have been on antibiotics for a full 24 hours, you are no longer considered contagious.   I have provided you with a note to return to work. ?  ?Even if you are feeling better, please make sure that you finish the full 10-day course and do not skip any doses.  Failure to complete a full course of antibiotics for strep throat can result in worsening infection that may require longer treatment with stronger antibiotics. ? ?Please see the list below  for recommended medications, dosages and frequencies to provide relief of your current symptoms:   ? ?Omnicef (cefdinir):  1 capsule twice daily for 10 days, you can take it with or without food.  This antibiotic can cause upset stomach, this will resolve once antibiotics are complete.  You are welcome to use a probiotic, eat yogurt, take Imodium while taking this medication.  Please avoid other systemic medications such as Maalox, Pepto-Bismol or milk of magnesia as they can interfere with your body's ability to absorb the antibiotics. ?  ?It is very important that you take antibiotics as prescribed.  If you skip doses or do not complete the full course of antibiotics, you put yourself at significant risk of recurrent infection which can often be worse than your initial infection. ?  ?Diflucan (fluconazole): As you may or may not be aware, taking antibiotics can often cause patients to develop a vaginal yeast infection.  For this reason, I have provided you with a prescription for Diflucan.  Please take the first Diflucan tablet on day 3 or 4 of your antibiotic therapy, and take the second Diflucan tablet 3 days later.  You do not need to pick up this prescription or take this medication unless you develop symptoms of vaginal yeast infection including thick, [place] vaginal discharge and/or vaginal itching.  This prescription has been provided as a Research officer, political party and for your convenience. ?   ?Advil, Motrin (ibuprofen): This is a good anti-inflammatory medication which addresses  aches, pains and inflammation of the upper airways that causes sinus and nasal congestion as well as in the lower airways which makes your cough feel tight and sometimes burn.  I recommend that you take between 400 to 600 mg every 6-8 hours as needed, I have provided you with a prescription for 600 mg.    ?  ?Xylocaine (lidocaine): This is a numbing medication that can be swished for 15 seconds and swallowed.  You can use this every 3 hours while  awake to relieve pain in your mouth and throat.  I have sent a prescription for this medication to your pharmacy. ? ? ?  ? ? ? ?

## 2022-05-08 NOTE — ED Triage Notes (Addendum)
Fever, headache, body aches x 4 days. Sore throat and ear pain starting this morning. Took ibuprofen at 7:00 am this morning. Temp 101.3 in triage. Works at [employer] and exposed to many sick contacts. At home covid test negative ?

## 2022-05-08 NOTE — ED Provider Notes (Signed)
?[facility] ? ? ? ?CSN: [id number] ?Arrival date & time: 05/08/22  5449 ?  ? ?HISTORY  ? ?Chief Complaint  ?Patient presents with  ? Sore Throat  ? Fever  ? ?HPI ?Rachael Sherman is a 36 y.o. female. Fever, headache, body aches x 4 days. Sore throat and ear pain starting this morning. Took ibuprofen at 7:00 am this morning. Temp 101.3 in triage. Works at [employer] and exposed to many sick contacts. At home covid test negative ? ?The history is provided by the patient.  ?Past Medical History:  ?Diagnosis Date  ? Allergy   ? Anxiety   ? Endometriosis   ? Hypertension   ? [employer] 11/25/2020  ? ?Patient Active Problem List  ? Diagnosis Date Noted  ? Vitamin D deficiency 12/08/2020  ? Migraine 11/26/2020  ? Essential hypertension 01/31/2020  ? Atypical chest pain 01/31/2020  ? ?Past Surgical History:  ?Procedure Laterality Date  ? BREAST EXCISIONAL BIOPSY Left   ? fibroadenoma  ? BREAST SURGERY    ? tumor (benign) left breast removal   ? CESAREAN SECTION    ? 2x  ? laproscopic    ? endometriosis  ? TUBAL LIGATION N/A   ? [employer] 11/25/2020  ? ?OB History   ?No obstetric history on file. ?  ? ?Home Medications   ? ?Prior to Admission medications   ?Medication Sig Start Date End Date Taking? Authorizing Provider  ?albuterol (VENTOLIN HFA) 108 (90 Base) MCG/ACT inhaler Inhale 2 puffs into the lungs every 4 (four) hours as needed for wheezing or shortness of breath. 12/26/21   Valinda Hoar, NP  ?amitriptyline (ELAVIL) 10 MG tablet TAKE 1 TABLET (10 MG TOTAL) BY MOUTH AT BEDTIME. 11/25/20 11/25/21  Mayers, Cari S, PA-C  ?amlodipine-olmesartan (AZOR) 10-20 MG tablet Take 1 tablet by mouth daily. 03/15/22   Janeece Agee, NP  ?hydrOXYzine (ATARAX/VISTARIL) 25 MG tablet Take 1 tablet (25 mg total) by mouth 3 (three) times daily as needed. 08/08/19   Claiborne Rigg, NP  ?ondansetron (ZOFRAN-ODT) 4 MG disintegrating tablet Take 1 tablet (4 mg total) by mouth every 8 (eight) hours as needed for nausea or  vomiting. 01/09/22   Mardella Layman, MD  ? ?Family History ?Family History  ?Adopted: Yes  ?Problem Relation Age of Onset  ? Hypertension Mother   ? Healthy Mother   ? Drug abuse Mother   ? Hypertension Father   ? Healthy Father   ? Anxiety disorder Brother   ? ?Social History ?Social History  ? ?Tobacco Use  ? Smoking status: Never  ? Smokeless tobacco: Never  ?Vaping Use  ? Vaping Use: Never used  ?Substance Use Topics  ? Alcohol use: Yes  ?  Comment: socially  ? Drug use: No  ? ?Allergies   ?Patient has no known allergies. ? ?Review of Systems ?Review of Systems ?Pertinent findings noted in history of present illness.  ? ?Physical Exam ?Triage Vital Signs ?ED Triage Vitals  ?Enc Vitals Group  ?   BP 10/23/21 0827 (!) 147/82  ?   Pulse Rate 10/23/21 0827 72  ?   Resp 10/23/21 0827 18  ?   Temp 10/23/21 0827 98.3 ?F (36.8 ?C)  ?   Temp Source 10/23/21 0827 Oral  ?   SpO2 10/23/21 0827 98 %  ?   Weight --   ?   Height --   ?   Head Circumference --   ?   Peak Flow --   ?  Pain Score 10/23/21 0826 5  ?   Pain Loc --   ?   Pain Edu? --   ?   Excl. in GC? --   ?No data found. ? ?Updated Vital Signs ?BP (!) 147/89 (BP Location: Left Arm)   Pulse (!) 101   Temp (!) 101.3 ?F (38.5 ?C) (Oral)   Resp 16   SpO2 98%  ? ?Physical Exam ?Constitutional:   ?   General: She is not in acute distress. ?   Appearance: She is well-developed. She is ill-appearing. She is not toxic-appearing.  ?HENT:  ?   Head: Normocephalic and atraumatic.  ?   Salivary Glands: Right salivary gland is diffusely enlarged and tender. Left salivary gland is diffusely enlarged and tender.  ?   Right Ear: Hearing and external ear normal.  ?   Left Ear: Hearing and external ear normal.  ?   Ears:  ?   Comments: Bilateral EACs with mild erythema, bilateral TMs are normal ?   Nose: No mucosal edema, congestion or rhinorrhea.  ?   Right Turbinates: Not enlarged, swollen or pale.  ?   Left Turbinates: Not enlarged or swollen.  ?   Right Sinus: No maxillary  sinus tenderness or frontal sinus tenderness.  ?   Left Sinus: No maxillary sinus tenderness or frontal sinus tenderness.  ?   Mouth/Throat:  ?   Lips: Pink. No lesions.  ?   Mouth: Mucous membranes are moist. No oral lesions or angioedema.  ?   Dentition: No gingival swelling.  ?   Tongue: No lesions.  ?   Palate: No mass.  ?   Pharynx: Uvula midline. Pharyngeal swelling, oropharyngeal exudate and posterior oropharyngeal erythema present. No uvula swelling.  ?   Tonsils: Tonsillar exudate present. 2+ on the right. 2+ on the left.  ?Eyes:  ?   Extraocular Movements: Extraocular movements intact.  ?   Conjunctiva/sclera: Conjunctivae normal.  ?   Pupils: Pupils are equal, round, and reactive to light.  ?Neck:  ?   Thyroid: No thyroid mass, thyromegaly or thyroid tenderness.  ?   Trachea: Tracheal tenderness present. No abnormal tracheal secretions or tracheal deviation.  ?   Comments: Voice is muffled ?Cardiovascular:  ?   Rate and Rhythm: Normal rate and regular rhythm.  ?   Pulses: Normal pulses.  ?   Heart sounds: Normal heart sounds, S1 normal and S2 normal. No murmur heard. ?  No friction rub. No gallop.  ?Pulmonary:  ?   Effort: Pulmonary effort is normal. No accessory muscle usage, prolonged expiration, respiratory distress or retractions.  ?   Breath sounds: No stridor, decreased air movement or transmitted upper airway sounds. No decreased breath sounds, wheezing, rhonchi or rales.  ?Abdominal:  ?   General: Bowel sounds are normal.  ?   Palpations: Abdomen is soft.  ?   Tenderness: There is generalized abdominal tenderness. There is no right CVA tenderness, left CVA tenderness or rebound. Negative signs include Murphy's sign.  ?   Hernia: No hernia is present.  ?Musculoskeletal:     ?   General: No tenderness. Normal range of motion.  ?   Cervical back: Full passive range of motion without pain, normal range of motion and neck supple.  ?   Right lower leg: No edema.  ?   Left lower leg: No edema.   ?Lymphadenopathy:  ?   Cervical: Cervical adenopathy present.  ?   Right cervical: Superficial cervical adenopathy  present.  ?   Left cervical: Superficial cervical adenopathy present.  ?Skin: ?   General: Skin is warm and dry.  ?   Findings: No erythema, lesion or rash.  ?Neurological:  ?   General: No focal deficit present.  ?   Mental Status: She is alert and oriented to person, place, and time. Mental status is at baseline.  ?Psychiatric:     ?   Mood and Affect: Mood normal.     ?   Behavior: Behavior normal.     ?   Thought Content: Thought content normal.     ?   Judgment: Judgment normal.  ? ? ?Visual Acuity ?Right Eye Distance:   ?Left Eye Distance:   ?Bilateral Distance:   ? ?Right Eye Near:   ?Left Eye Near:    ?Bilateral Near:    ? ?UC Couse / Diagnostics / Procedures:  ?  ?EKG ? ?Radiology ?No results found. ? ?Procedures ?Procedures (including critical care time) ? ?UC Diagnoses / Final Clinical Impressions(s)   ?I have reviewed the triage vital signs and the nursing notes. ? ?Pertinent labs & imaging results that were available during my care of the patient were reviewed by me and considered in my medical decision making (see chart for details).   ?Final diagnoses:  ?Acute bacterial tonsillitis  ?Fever, unspecified fever cause  ? ?Rapid strep test today is negative, throat culture will be performed per protocol.  Patient provided with antibiotics for presumed bacterial pharyngitis.  Return precautions advised. ? ?ED Prescriptions   ? ? Medication Sig Dispense Auth. Provider  ? cefdinir (OMNICEF) 300 MG capsule Take 1 capsule (300 mg total) by mouth 2 (two) times daily for 10 days. 20 capsule Theadora RamaMorgan, Aniela Caniglia Scales, PA-C  ? fluconazole (DIFLUCAN) 150 MG tablet Take 1 tablet on day 4 of antibiotics.  Take second tablet 3 days later. 2 tablet Theadora RamaMorgan, Karalynn Cottone Scales, PA-C  ? lidocaine (XYLOCAINE) 2 % solution Use as directed 15 mLs in the mouth or throat every 3 (three) hours as needed for mouth pain  (Sore throat). 300 mL Theadora RamaMorgan, Geovanni Rahming Scales, PA-C  ? ibuprofen (ADVIL) 600 MG tablet Take 1 tablet (600 mg total) by mouth every 8 (eight) hours as needed for up to 30 doses for fever, headache, mild pain or moderate pa

## 2022-05-11 LAB — CULTURE, GROUP A STREP (THRC)

## 2022-05-16 ENCOUNTER — Telehealth: Payer: No Typology Code available for payment source | Admitting: Nurse Practitioner

## 2022-05-16 DIAGNOSIS — R051 Acute cough: Secondary | ICD-10-CM | POA: Diagnosis not present

## 2022-05-16 MED ORDER — BENZONATATE 100 MG PO CAPS
100.0000 mg | ORAL_CAPSULE | Freq: Three times a day (TID) | ORAL | 0 refills | Status: DC | PRN
  Filled 2022-05-16: qty 30, 10d supply, fill #0

## 2022-05-16 NOTE — Progress Notes (Signed)
We are sorry that you are not feeling well.  Here is how we plan to help!  Based on your presentation I believe you most likely have A cough due to a virus.  This is called viral bronchitis and is best treated by rest, plenty of fluids and control of the cough.  You may use Ibuprofen or Tylenol as directed to help your symptoms.     In addition you may use A prescription cough medication called Tessalon Perles 100mg. You may take 1-2 capsules every 8 hours as needed for your cough.  From your responses in the eVisit questionnaire you describe inflammation in the upper respiratory tract which is causing a significant cough.  This is commonly called Bronchitis and has four common causes:   Allergies Viral Infections Acid Reflux Bacterial Infection Allergies, viruses and acid reflux are treated by controlling symptoms or eliminating the cause. An example might be a cough caused by taking certain blood pressure medications. You stop the cough by changing the medication. Another example might be a cough caused by acid reflux. Controlling the reflux helps control the cough.  USE OF BRONCHODILATOR ("RESCUE") INHALERS: There is a risk from using your bronchodilator too frequently.  The risk is that over-reliance on a medication which only relaxes the muscles surrounding the breathing tubes can reduce the effectiveness of medications prescribed to reduce swelling and congestion of the tubes themselves.  Although you feel brief relief from the bronchodilator inhaler, your asthma may actually be worsening with the tubes becoming more swollen and filled with mucus.  This can delay other crucial treatments, such as oral steroid medications. If you need to use a bronchodilator inhaler daily, several times per day, you should discuss this with your provider.  There are probably better treatments that could be used to keep your asthma under control.     HOME CARE Only take medications as instructed by your medical  team. Complete the entire course of an antibiotic. Drink plenty of fluids and get plenty of rest. Avoid close contacts especially the very young and the elderly Cover your mouth if you cough or cough into your sleeve. Always remember to wash your hands A steam or ultrasonic humidifier can help congestion.   GET HELP RIGHT AWAY IF: You develop worsening fever. You become short of breath You cough up blood. Your symptoms persist after you have completed your treatment plan MAKE SURE YOU  Understand these instructions. Will watch your condition. Will get help right away if you are not doing well or get worse.    Thank you for choosing an e-visit.  Your e-visit answers were reviewed by a board certified advanced clinical practitioner to complete your personal care plan. Depending upon the condition, your plan could have included both over the counter or prescription medications.  Please review your pharmacy choice. Make sure the pharmacy is open so you can pick up prescription now. If there is a problem, you may contact your provider through MyChart messaging and have the prescription routed to another pharmacy.  Your safety is important to us. If you have drug allergies check your prescription carefully.   For the next 24 hours you can use MyChart to ask questions about today's visit, request a non-urgent call back, or ask for a work or school excuse. You will get an email in the next two days asking about your experience. I hope that your e-visit has been valuable and will speed your recovery.   I have spent at   least 5 minutes reviewing and documenting in the patient's chart.  

## 2022-05-17 ENCOUNTER — Other Ambulatory Visit (HOSPITAL_COMMUNITY): Payer: Self-pay

## 2022-06-03 ENCOUNTER — Other Ambulatory Visit (HOSPITAL_COMMUNITY): Payer: Self-pay

## 2022-06-03 ENCOUNTER — Encounter: Payer: Self-pay | Admitting: Registered Nurse

## 2022-06-03 ENCOUNTER — Ambulatory Visit (INDEPENDENT_AMBULATORY_CARE_PROVIDER_SITE_OTHER): Payer: No Typology Code available for payment source | Admitting: Registered Nurse

## 2022-06-03 ENCOUNTER — Other Ambulatory Visit (HOSPITAL_COMMUNITY)
Admission: RE | Admit: 2022-06-03 | Discharge: 2022-06-03 | Disposition: A | Discharge status: Home / Self Care | Payer: No Typology Code available for payment source | Source: Ambulatory Visit | Attending: Registered Nurse | Admitting: Registered Nurse

## 2022-06-03 VITALS — BP 128/74 | HR 89 | Temp 98.3°F | Resp 18 | Ht 62.0 in | Wt 106.4 lb

## 2022-06-03 DIAGNOSIS — F419 Anxiety disorder, unspecified: Secondary | ICD-10-CM | POA: Diagnosis not present

## 2022-06-03 DIAGNOSIS — R42 Dizziness and giddiness: Secondary | ICD-10-CM

## 2022-06-03 DIAGNOSIS — G43809 Other migraine, not intractable, without status migrainosus: Secondary | ICD-10-CM | POA: Diagnosis not present

## 2022-06-03 DIAGNOSIS — R8290 Unspecified abnormal findings in urine: Secondary | ICD-10-CM | POA: Insufficient documentation

## 2022-06-03 LAB — URINALYSIS, ROUTINE W REFLEX MICROSCOPIC
Bilirubin Urine: NEGATIVE
Hgb urine dipstick: NEGATIVE
Ketones, ur: NEGATIVE
Leukocytes,Ua: NEGATIVE
Nitrite: NEGATIVE
RBC / HPF: NONE SEEN (ref 0–?)
Specific Gravity, Urine: 1.015 (ref 1.000–1.030)
Total Protein, Urine: NEGATIVE
Urine Glucose: NEGATIVE
Urobilinogen, UA: 1 (ref 0.0–1.0)
WBC, UA: NONE SEEN (ref 0–?)
pH: 8 (ref 5.0–8.0)

## 2022-06-03 MED ORDER — HYDROXYZINE HCL 25 MG PO TABS
25.0000 mg | ORAL_TABLET | Freq: Three times a day (TID) | ORAL | 1 refills | Status: DC | PRN
  Filled 2022-06-03: qty 60, 20d supply, fill #0

## 2022-06-03 MED ORDER — SUMATRIPTAN SUCCINATE 50 MG PO TABS
50.0000 mg | ORAL_TABLET | ORAL | 0 refills | Status: DC | PRN
  Filled 2022-06-03: qty 10, 30d supply, fill #0

## 2022-06-03 MED ORDER — UBROGEPANT 50 MG PO TABS
1.0000 | ORAL_TABLET | Freq: Every day | ORAL | 1 refills | Status: DC | PRN
  Filled 2022-06-03: qty 30, 30d supply, fill #0

## 2022-06-03 MED ORDER — FLUTICASONE PROPIONATE 50 MCG/ACT NA SUSP
2.0000 | Freq: Every day | NASAL | 6 refills | Status: DC
  Filled 2022-06-03: qty 16, 30d supply, fill #0

## 2022-06-03 MED ORDER — MECLIZINE HCL 12.5 MG PO TABS
12.5000 mg | ORAL_TABLET | Freq: Three times a day (TID) | ORAL | 0 refills | Status: DC | PRN
  Filled 2022-06-03: qty 30, 10d supply, fill #0

## 2022-06-03 NOTE — Patient Instructions (Addendum)
Great to see you! Great work on the BP  Call with concerns  I'll let you know how labs look  Thanks,  Rich     If you have lab work done today you will be contacted with your lab results within the next 2 weeks.  If you have not heard from [place] then please contact us. The fastest way to get your results is to register for My Chart.   IF you received an x-ray today, you will receive an invoice from [facility]. Please contact [facility] at [phone] with questions or concerns regarding your invoice.   IF you received labwork today, you will receive an invoice from [facility]. Please contact [facility] at [phone] with questions or concerns regarding your invoice.   Our billing staff will not be able to assist you with questions regarding bills from these companies.  You will be contacted with the lab results as soon as they are available. The fastest way to get your results is to activate your My Chart account. Instructions are located on the last page of this paperwork. If you have not heard from [place] regarding the results in 2 weeks, please contact this office.

## 2022-06-03 NOTE — Progress Notes (Signed)
Acute Office Visit  Subjective:    Patient ID: Rachael Sherman, female    DOB: January 23, 1986, 36 y.o.   MRN: [id number]  Chief Complaint  Patient presents with   Dizziness    Patient states she is experiencing vertigo and nausea again and also had mono last month and wanted to make you aware.    HPI Patient is in today for dizziness   Notes she had mono last month, still some fatigue. Had been tx for bacterial tonsillitis.   Dizziness started around 1 mo ago. Has persisted over past mo.  Has spells of 5 minutes of dizziness every few days.  States that it feels like both her and the world around her are both moving.   Occ lightheadedness and nausea Denies LOC, vomiting, diarrhea.  Migraines have been a little bit more frequent than previously.   Has taken zofran for nausea, but otherwise no treatment.  Does note some cloudy urine. No known STI exposure No flank pain No vaginal symptoms. Would like urinalysis and STI check, declines HIV and RPR  Outpatient Medications Prior to Visit  Medication Sig Dispense Refill   albuterol (VENTOLIN HFA) 108 (90 Base) MCG/ACT inhaler Inhale 2 puffs into the lungs every 4 (four) hours as needed for wheezing or shortness of breath. 18 g 0   amlodipine-olmesartan (AZOR) 10-20 MG tablet Take 1 tablet by mouth daily. 90 tablet 0   benzonatate (TESSALON PERLES) 100 MG capsule Take 1 capsule (100 mg total) by mouth 3 (three) times daily as needed for cough. 30 capsule 0   fluconazole (DIFLUCAN) 150 MG tablet Take 1 tablet on day 4 of antibiotics.  Take second tablet 3 days later. 2 tablet 0   ibuprofen (ADVIL) 600 MG tablet Take 1 tablet (600 mg total) by mouth every 8 (eight) hours as needed for up to 30 doses for fever, headache, mild pain or moderate pain (Inflammation). Take 1 tablet 3 times daily as needed for inflammation of upper airways and/or pain. 30 tablet 0   lidocaine (XYLOCAINE) 2 % solution Use as directed 15 mLs in the mouth or  throat every 3 (three) hours as needed for mouth pain (Sore throat). 300 mL 0   ondansetron (ZOFRAN-ODT) 4 MG disintegrating tablet Take 1 tablet (4 mg total) by mouth every 8 (eight) hours as needed for nausea or vomiting. 15 tablet 0   hydrOXYzine (ATARAX/VISTARIL) 25 MG tablet Take 1 tablet (25 mg total) by mouth 3 (three) times daily as needed. 60 tablet 1   amitriptyline (ELAVIL) 10 MG tablet TAKE 1 TABLET (10 MG TOTAL) BY MOUTH AT BEDTIME. 30 tablet 0   No facility-administered medications prior to visit.    Review of Systems  Constitutional: Negative.   HENT: Negative.    Eyes: Negative.   Respiratory: Negative.    Cardiovascular: Negative.   Gastrointestinal: Negative.   Endocrine: Negative.   Genitourinary: Negative.   Musculoskeletal: Negative.   Skin: Negative.   Allergic/Immunologic: Negative.   Neurological:  Positive for dizziness and light-headedness. Negative for tremors, seizures, syncope, facial asymmetry, speech difficulty, weakness, numbness and headaches.  Hematological: Negative.   Psychiatric/Behavioral: Negative.    All other systems reviewed and are negative.      Objective:    BP 128/74   Pulse 89   Temp 98.3 F (36.8 C) (Temporal)   Resp 18   Ht 5\' 2"  (1.575 m)   Wt 106 lb 6.4 oz (48.3 kg)   SpO2 98%   BMI  19.46 kg/m  Physical Exam Vitals and nursing note reviewed.  Constitutional:      General: She is not in acute distress.    Appearance: Normal appearance. She is normal weight. She is not ill-appearing, toxic-appearing or diaphoretic.  HENT:     Right Ear: Tympanic membrane, ear canal and external ear normal. There is no impacted cerumen.     Left Ear: Tympanic membrane, ear canal and external ear normal. There is no impacted cerumen.  Cardiovascular:     Rate and Rhythm: Normal rate and regular rhythm.     Heart sounds: Normal heart sounds. No murmur heard.    No friction rub. No gallop.  Pulmonary:     Effort: Pulmonary effort is  normal. No respiratory distress.     Breath sounds: Normal breath sounds. No stridor. No wheezing, rhonchi or rales.  Chest:     Chest wall: No tenderness.  Skin:    General: Skin is warm and dry.  Neurological:     General: No focal deficit present.     Mental Status: She is alert and oriented to person, place, and time. Mental status is at baseline.     Cranial Nerves: No cranial nerve deficit.     Sensory: No sensory deficit.     Motor: No weakness.     Coordination: Coordination normal.     Gait: Gait normal.     Deep Tendon Reflexes: Reflexes normal.  Psychiatric:        Mood and Affect: Mood normal.        Behavior: Behavior normal.        Thought Content: Thought content normal.        Judgment: Judgment normal.     No results found for any visits on 06/03/22.      Assessment & Plan:  1. Anxiety - hydrOXYzine (ATARAX) 25 MG tablet; Take 1 tablet (25 mg total) by mouth 3 (three) times daily as needed.  Dispense: 60 tablet; Refill: 1  2. Other migraine without status migrainosus, not intractable - Ubrogepant 50 MG TABS; Take 1 tablet by mouth daily as needed (severe migraine).  Dispense: 30 tablet; Refill: 1 - SUMAtriptan (IMITREX) 50 MG tablet; Take 1 tablet (50 mg total) by mouth every 2 (two) hours as needed for migraine. May repeat in 2 hours if headache persists or recurs.  Dispense: 10 tablet; Refill: 0  3. Dizziness - fluticasone (FLONASE) 50 MCG/ACT nasal spray; Place 2 sprays into both nostrils daily.  Dispense: 16 g; Refill: 6 - meclizine (ANTIVERT) 12.5 MG tablet; Take 1 tablet (12.5 mg total) by mouth 3 (three) times daily as needed for dizziness.  Dispense: 30 tablet; Refill: 0  4. Cloudy urine - Urinalysis, Routine w reflex microscopic - Urine cytology ancillary only([facility])    Meds ordered this encounter  Medications   hydrOXYzine (ATARAX) 25 MG tablet    Sig: Take 1 tablet (25 mg total) by mouth 3 (three) times daily as needed.    Dispense:   60 tablet    Refill:  1   Ubrogepant 50 MG TABS    Sig: Take 1 tablet by mouth daily as needed (severe migraine).    Dispense:  30 tablet    Refill:  1    Order Specific Question:   Supervising Provider    Answer:   Neva Seat, JEFFREY R [[id number]]   SUMAtriptan (IMITREX) 50 MG tablet    Sig: Take 1 tablet (50 mg total) by mouth every 2 (two)  hours as needed for migraine. May repeat in 2 hours if headache persists or recurs.    Dispense:  10 tablet    Refill:  0    Order Specific Question:   Supervising Provider    Answer:   Neva Seat, JEFFREY R [[id number]]   fluticasone (FLONASE) 50 MCG/ACT nasal spray    Sig: Place 2 sprays into both nostrils daily.    Dispense:  16 g    Refill:  6    Order Specific Question:   Supervising Provider    Answer:   Neva Seat, JEFFREY R [[id number]]   meclizine (ANTIVERT) 12.5 MG tablet    Sig: Take 1 tablet (12.5 mg total) by mouth 3 (three) times daily as needed for dizziness.    Dispense:  30 tablet    Refill:  0    Order Specific Question:   Supervising Provider    Answer:   Neva Seat, JEFFREY R [[id number]]    No follow-ups on file.  PLAN Vertigo vs post infectious. Will give flonase and meclizine, monitor. No acute neuro findings to suggest more serious etiology Urinalysis and cytology sent, follow up as indicated. Patient encouraged to call clinic with any questions, comments, or concerns.  Janeece Agee, NP

## 2022-06-06 ENCOUNTER — Telehealth: Payer: No Typology Code available for payment source | Admitting: Family

## 2022-06-06 DIAGNOSIS — B002 Herpesviral gingivostomatitis and pharyngotonsillitis: Secondary | ICD-10-CM

## 2022-06-06 MED ORDER — VALACYCLOVIR HCL 1 G PO TABS: 2000.0000 mg | ORAL_TABLET | Freq: Two times a day (BID) | ORAL | 0 refills | Status: AC

## 2022-06-06 NOTE — Progress Notes (Signed)

## 2022-06-08 LAB — URINE CYTOLOGY ANCILLARY ONLY
Bacterial Vaginitis-Urine: NEGATIVE
Candida Urine: NEGATIVE
Chlamydia: NEGATIVE
Comment: NEGATIVE
Comment: NEGATIVE
Comment: NORMAL
Neisseria Gonorrhea: NEGATIVE
Trichomonas: NEGATIVE

## 2022-06-09 ENCOUNTER — Telehealth: Payer: Self-pay

## 2022-06-10 ENCOUNTER — Telehealth: Payer: No Typology Code available for payment source | Admitting: Family Medicine

## 2022-06-10 DIAGNOSIS — B001 Herpesviral vesicular dermatitis: Secondary | ICD-10-CM

## 2022-06-10 NOTE — Progress Notes (Signed)
Rachael Sherman   Needs to be followed up in person given that the prescribed treatment for cold sores did not work.  Message and detail of this was sent.

## 2022-06-10 NOTE — Telephone Encounter (Signed)
Faxed PA form to [employer] waiting on response

## 2022-06-22 ENCOUNTER — Ambulatory Visit: Payer: No Typology Code available for payment source | Admitting: Registered Nurse

## 2022-06-28 ENCOUNTER — Telehealth: Payer: Self-pay | Admitting: Urgent Care

## 2022-06-28 ENCOUNTER — Other Ambulatory Visit (HOSPITAL_COMMUNITY): Payer: Self-pay

## 2022-06-28 DIAGNOSIS — J0100 Acute maxillary sinusitis, unspecified: Secondary | ICD-10-CM

## 2022-06-28 MED ORDER — AMOXICILLIN-POT CLAVULANATE 875-125 MG PO TABS
1.0000 | ORAL_TABLET | Freq: Two times a day (BID) | ORAL | 0 refills | Status: AC
Start: 2022-06-28 — End: 2022-07-13
  Filled 2022-06-28 – 2022-07-06 (×2): qty 14, 7d supply, fill #0

## 2022-06-28 NOTE — Progress Notes (Signed)

## 2022-07-06 ENCOUNTER — Other Ambulatory Visit (HOSPITAL_COMMUNITY): Payer: Self-pay

## 2022-07-19 ENCOUNTER — Encounter: Payer: Self-pay | Admitting: Nurse Practitioner

## 2022-07-23 ENCOUNTER — Encounter: Payer: Self-pay | Admitting: Nurse Practitioner

## 2022-07-23 NOTE — Progress Notes (Deleted)
   Rachael Sherman 04/29/1986 [id number]   History:  35 y.o. G *** presents as new patient to establish care and discuss endometriosis. Monthly cycles. Pap 07/2019 normal cytology + HR HPV, has not had pap since. Lap surgery for endometriosis.   Gynecologic History No LMP recorded.   Contraception/Family planning: {method:5051} Sexually active: ***  Health Maintenance Last Pap: 08/22/2019. Results were: Normal cytology + HR HPV Last mammogram: Not indicated Last colonoscopy: Not indicated Last Dexa: Not indicated   Past medical history, past surgical history, family history and social history were all reviewed and documented in the EPIC chart.  ROS:  A ROS was performed and pertinent positives and negatives are included.  Exam:  There were no vitals filed for this visit. There is no height or weight on file to calculate BMI.  General appearance:  Normal Thyroid:  Symmetrical, normal in size, without palpable masses or nodularity. Respiratory  Auscultation:  Clear without wheezing or rhonchi Cardiovascular  Auscultation:  Regular rate, without rubs, murmurs or gallops  Edema/varicosities:  Not grossly evident Abdominal  Soft,nontender, without masses, guarding or rebound.  Liver/spleen:  No organomegaly noted  Hernia:  None appreciated  Skin  Inspection:  Grossly normal Breasts: Examined lying and sitting.   Right: Without masses, retractions, nipple discharge or axillary adenopathy.   Left: Without masses, retractions, nipple discharge or axillary adenopathy. Genitourinary   Inguinal/mons:  Normal without inguinal adenopathy  External genitalia:  Normal appearing vulva with no masses, tenderness, or lesions  BUS/Urethra/Skene's glands:  Normal  Vagina:  Normal appearing with normal color and discharge, no lesions  Cervix:  Normal appearing without discharge or lesions  Uterus:  Normal in size, shape and contour.  Midline and mobile, nontender  Adnexa/parametria:      Rt: Normal in size, without masses or tenderness.   Lt: Normal in size, without masses or tenderness.  Anus and perineum: Normal  Digital rectal exam: Not indicated  Patient informed chaperone available to be present for breast and pelvic exam. Patient has requested no chaperone to be present. Patient has been advised what will be completed during breast and pelvic exam.   Assessment/Plan:  36 y.o. G *** to establish care.    Return in 1 year for annual.   Rachael Mackie DNP, 9:30 AM 07/23/2022

## 2022-08-03 ENCOUNTER — Other Ambulatory Visit (HOSPITAL_COMMUNITY): Payer: Self-pay

## 2022-08-03 ENCOUNTER — Telehealth: Payer: Self-pay | Admitting: *Deleted

## 2022-08-03 ENCOUNTER — Ambulatory Visit: Payer: No Typology Code available for payment source | Admitting: Nurse Practitioner

## 2022-08-03 ENCOUNTER — Other Ambulatory Visit (HOSPITAL_COMMUNITY)
Admission: RE | Admit: 2022-08-03 | Discharge: 2022-08-03 | Disposition: A | Discharge status: Home / Self Care | Payer: No Typology Code available for payment source | Source: Ambulatory Visit | Attending: Nurse Practitioner | Admitting: Nurse Practitioner

## 2022-08-03 ENCOUNTER — Encounter: Payer: Self-pay | Admitting: Nurse Practitioner

## 2022-08-03 VITALS — BP 124/82 | HR 83 | Ht 62.75 in | Wt 106.0 lb

## 2022-08-03 DIAGNOSIS — R87810 Cervical high risk human papillomavirus (HPV) DNA test positive: Secondary | ICD-10-CM | POA: Insufficient documentation

## 2022-08-03 DIAGNOSIS — Z01419 Encounter for gynecological examination (general) (routine) without abnormal findings: Secondary | ICD-10-CM | POA: Diagnosis not present

## 2022-08-03 DIAGNOSIS — N946 Dysmenorrhea, unspecified: Secondary | ICD-10-CM

## 2022-08-03 DIAGNOSIS — N809 Endometriosis, unspecified: Secondary | ICD-10-CM

## 2022-08-03 DIAGNOSIS — L309 Dermatitis, unspecified: Secondary | ICD-10-CM

## 2022-08-03 MED ORDER — TRIAMCINOLONE ACETONIDE 0.5 % EX OINT
1.0000 | TOPICAL_OINTMENT | Freq: Two times a day (BID) | CUTANEOUS | 0 refills | Status: DC
  Filled 2022-08-03: qty 30, 15d supply, fill #0

## 2022-08-03 MED ORDER — ORILISSA 200 MG PO TABS: 1.0000 | ORAL_TABLET | Freq: Two times a day (BID) | ORAL | 5 refills | Status: DC

## 2022-08-03 NOTE — Progress Notes (Signed)
Rachael Sherman 1986-06-29 [id number]   History:  36 y.o. G2P2002 presents as new patient to establish care and discuss endometriosis. Monthly cycles. Report cycles have become less heavy and shorter in length, although she has severe dysmenorrhea sometimes accompanied by nausea and vomiting. Takes Tylenol and Ibuprofen with minimal relief. Intermittent breakthrough bleeding mid cycle. Endometriosis diagnosed laparoscopically in 2014. Has been on Depo, patch, OCPs, Lupron. Did not tolerate Lupron side effects. She has tubal but is considering reversal as she is getting married and considering future pregnancy. 2014 abnormal pap, no intervention required. Pap 2020 normal cytology + HR HPV. H/O migraines and HTN. Has nipple piercings and experiences itchiness with red bumps occasionally, uses triamcinolone ointment.   Gynecologic History Patient's last menstrual period was 07/17/2022 (exact date). Period Cycle (Days): 25 Period Duration (Days): 3-4 Menstrual Flow: Moderate Menstrual Control: Maxi pad Dysmenorrhea: (!) Severe Dysmenorrhea Symptoms: Cramping, Nausea, Diarrhea Contraception/Family planning: tubal ligation Sexually active: Yes  Health Maintenance Last Pap: 08/22/2019. Results were: Normal cytology + HR HPV Last mammogram: 04/05/2019 (right ultrasound). Results were: Normal Last colonoscopy: Not indicated Last Dexa: Not indicated  Past medical history, past surgical history, family history and social history were all reviewed and documented in the EPIC chart. Engaged. Currently moving. Has 8 and 35 yo. Works for [employer] at [employer].   ROS:  A ROS was performed and pertinent positives and negatives are included.  Exam:  Vitals:   08/03/22 0954  BP: 124/82  Pulse: 83  SpO2: 92%  Weight: 106 lb (48.1 kg)  Height: 5' 2.75" (1.594 m)   Body mass index is 18.93 kg/m.  General appearance:  Normal Thyroid:  Symmetrical, normal in size, without palpable masses or  nodularity. Respiratory  Auscultation:  Clear without wheezing or rhonchi Cardiovascular  Auscultation:  Regular rate, without rubs, murmurs or gallops  Edema/varicosities:  Not grossly evident Abdominal  Soft,nontender, without masses, guarding or rebound.  Liver/spleen:  No organomegaly noted  Hernia:  None appreciated  Skin  Inspection:  Grossly normal Breasts: Examined lying and sitting.   Right: Without masses, retractions, nipple discharge or axillary adenopathy.   Left: Without masses, retractions, nipple discharge or axillary adenopathy. Genitourinary   Inguinal/mons:  Normal without inguinal adenopathy  External genitalia:  Normal appearing vulva with no masses, tenderness, or lesions  BUS/Urethra/Skene's glands:  Normal  Vagina:  Normal appearing with normal color and discharge, no lesions  Cervix:  Normal appearing without discharge or lesions  Uterus:  Normal in size, shape and contour.  Midline and mobile, nontender  Adnexa/parametria:     Rt: Normal in size, without masses or tenderness.   Lt: Normal in size, without masses or tenderness.  Anus and perineum: Normal  Digital rectal exam: Normal sphincter tone without palpated masses or tenderness  Patient informed chaperone available to be present for breast and pelvic exam. Patient has requested no chaperone to be present. Patient has been advised what will be completed during breast and pelvic exam.   Assessment/Plan:  36 y.o. G2P2002 to establish care.   Well female exam with routine gynecological exam - Plan: Cytology - PAP( [facility]). Education provided on SBEs, importance of preventative screenings, current guidelines, high calcium diet, regular exercise, and multivitamin daily.  Labs with PCP.   Endometriosis determined by laparoscopy - 2014 laparoscopy. Has tried Depo, patch, COCs and Lupron. Did not tolerate Lupron. Will avoid estrogen due to HTN. Discussed option for POPs, IUD, and Nexplanon, as well as  [place]. Discussed  side effects and short-term use with Dewayne Hatch. She is interested in this. She has plans to try to conceive in the next 1-2 years. Recommend seeing fertility to discuss option of tubal reversal versus IVF. Information provided on [employer].   Dysmenorrhea - s/t endometriosis. See above.   Cervical high risk human papillomavirus (HPV) DNA test positive - Plan: Cytology - PAP( [facility]). Abnormal pap in 2014, no intervention per patient. 2020 pap normal+ HR HPV. Pap today.   Dermatitis of nipple - Plan: triamcinolone ointment (KENALOG) 0.5 % BID as needed.   Return in 1 year for annual or sooner if needed.      [name] DNP, 12:31 PM 08/03/2022

## 2022-08-03 NOTE — Telephone Encounter (Signed)
PA done cover my meds for Orlissa 200 mg tablet, pending response from insurance.

## 2022-08-03 NOTE — Telephone Encounter (Signed)
[employer] approved Rx from 08/03/22 to 02/02/23

## 2022-08-05 LAB — CYTOLOGY - PAP
Comment: NEGATIVE
Diagnosis: UNDETERMINED — AB
High risk HPV: NEGATIVE

## 2022-08-23 ENCOUNTER — Ambulatory Visit
Admission: EM | Admit: 2022-08-23 | Discharge: 2022-08-23 | Disposition: A | Discharge status: Home / Self Care | Payer: No Typology Code available for payment source | Attending: Physician Assistant | Admitting: Physician Assistant

## 2022-08-23 ENCOUNTER — Other Ambulatory Visit (HOSPITAL_COMMUNITY): Payer: Self-pay

## 2022-08-23 DIAGNOSIS — H1031 Unspecified acute conjunctivitis, right eye: Secondary | ICD-10-CM | POA: Diagnosis not present

## 2022-08-23 MED ORDER — POLYMYXIN B-TRIMETHOPRIM 10000-0.1 UNIT/ML-% OP SOLN
1.0000 [drp] | OPHTHALMIC | 0 refills | Status: AC
Start: 2022-08-23 — End: 2022-08-30
  Filled 2022-08-23: qty 10, 7d supply, fill #0

## 2022-08-23 NOTE — ED Triage Notes (Signed)
Pt presents with R eye redness, drainage and pain starting this morning.  Was crusted shut.

## 2022-08-23 NOTE — ED Provider Notes (Signed)
[facility]    CSN: [id number] Arrival date & time: 08/23/22  1224      History   Chief Complaint Chief Complaint  Patient presents with   Eye Pain    HPI Rachael Sherman is a 36 y.o. female.   Patient here today for evaluation of right eye pain, erythema, and drainage that she woke with this morning. She denies any known injury. She reports that she does wear contacts at times but not today. She has not had any nausea or vomiting. She denies fever. She has had some associated headache but states this has improved with tylenol.   The history is provided by the patient.  Eye Pain Associated symptoms include headaches. Pertinent negatives include no shortness of breath.    Past Medical History:  Diagnosis Date   Allergy    Anxiety    Endometriosis    Hypertension    Phreesia 11/25/2020    Patient Active Problem List   Diagnosis Date Noted   Endometriosis determined by laparoscopy 08/03/2022   Vitamin D deficiency 12/08/2020   Migraine 11/26/2020   Essential hypertension 01/31/2020   Atypical chest pain 01/31/2020    Past Surgical History:  Procedure Laterality Date   BREAST EXCISIONAL BIOPSY Left    fibroadenoma   BREAST SURGERY     tumor (benign) left breast removal    CESAREAN SECTION     2x   laproscopic     endometriosis   TUBAL LIGATION N/A    Phreesia 11/25/2020    OB History     Gravida  2   Para  2   Term  2   Preterm      AB      Living  2      SAB      IAB      Ectopic      Multiple      Live Births               Home Medications    Prior to Admission medications   Medication Sig Start Date End Date Taking? Authorizing Provider  trimethoprim-polymyxin b (POLYTRIM) ophthalmic solution Place 1 drop into the right eye every 4 (four) hours for 7 days. 08/23/22 08/30/22 Yes Tomi Bamberger, PA-C  amitriptyline (ELAVIL) 10 MG tablet TAKE 1 TABLET (10 MG TOTAL) BY MOUTH AT BEDTIME. 11/25/20 11/25/21  Mayers, Cari  S, PA-C  amlodipine-olmesartan (AZOR) 10-20 MG tablet Take 1 tablet by mouth daily. 03/15/22   Janeece Agee, NP  Elagolix Sodium (ORILISSA) 200 MG TABS Take 1 tablet by mouth in the morning and at bedtime. 08/03/22   [name], NP  fluticasone (FLONASE) 50 MCG/ACT nasal spray Place 2 sprays into both nostrils daily. 06/03/22   Janeece Agee, NP  hydrOXYzine (ATARAX) 25 MG tablet Take 1 tablet (25 mg total) by mouth 3 (three) times daily as needed. 06/03/22   Janeece Agee, NP  ibuprofen (ADVIL) 600 MG tablet Take 1 tablet (600 mg total) by mouth every 8 (eight) hours as needed for up to 30 doses for fever, headache, mild pain or moderate pain (Inflammation). Take 1 tablet 3 times daily as needed for inflammation of upper airways and/or pain. 05/08/22   Theadora Rama Scales, PA-C  meclizine (ANTIVERT) 12.5 MG tablet Take 1 tablet (12.5 mg total) by mouth 3 (three) times daily as needed for dizziness. 06/03/22   Janeece Agee, NP  ondansetron (ZOFRAN-ODT) 4 MG disintegrating tablet Take 1 tablet (4 mg  total) by mouth every 8 (eight) hours as needed for nausea or vomiting. 01/09/22   Mardella Layman, MD  SUMAtriptan (IMITREX) 50 MG tablet Take 1 tablet (50 mg total) by mouth every 2 (two) hours as needed for migraine. May repeat in 2 hours if headache persists or recurs. 06/03/22   Janeece Agee, NP  triamcinolone ointment (KENALOG) 0.5 % Apply 1 application topically 2 (two) times daily. 08/03/22   Wyline Beady A, NP  Ubrogepant 50 MG TABS Take 1 tablet by mouth daily as needed (severe migraine). 06/03/22   Janeece Agee, NP    Family History Family History  Adopted: Yes  Problem Relation Age of Onset   Hypertension Mother    Healthy Mother    Drug abuse Mother    Hypertension Father    Healthy Father    Anxiety disorder Brother    Diabetes Maternal Grandmother     Social History Social History   Tobacco Use   Smoking status: Never   Smokeless tobacco: Never  Vaping Use    Vaping Use: Never used  Substance Use Topics   Alcohol use: Yes    Comment: socially   Drug use: No     Allergies   Patient has no known allergies.   Review of Systems Review of Systems  Constitutional:  Negative for chills and fever.  Eyes:  Positive for photophobia, pain and discharge. Negative for visual disturbance.  Respiratory:  Negative for shortness of breath.   Gastrointestinal:  Negative for nausea and vomiting.  Neurological:  Positive for headaches.     Physical Exam Triage Vital Signs ED Triage Vitals  Enc Vitals Group     BP 08/23/22 1321 (!) 162/93     Pulse Rate 08/23/22 1321 75     Resp 08/23/22 1321 18     Temp 08/23/22 1321 98.1 F (36.7 C)     Temp Source 08/23/22 1321 Oral     SpO2 08/23/22 1321 98 %     Weight --      Height --      Head Circumference --      Peak Flow --      Pain Score 08/23/22 1318 7     Pain Loc --      Pain Edu? --      Excl. in GC? --    No data found.  Updated Vital Signs BP (!) 162/93 (BP Location: Right Arm)   Pulse 75   Temp 98.1 F (36.7 C) (Oral)   Resp 18   LMP 08/09/2022 (Exact Date)   SpO2 98%   Physical Exam Vitals and nursing note reviewed.  Constitutional:      General: She is not in acute distress.    Appearance: Normal appearance. She is not ill-appearing.  HENT:     Head: Normocephalic and atraumatic.     Nose: Nose normal. No congestion or rhinorrhea.  Eyes:     Comments: Right conjunctiva injected. Left conjunctiva WNL, mild photophobia to right  Cardiovascular:     Rate and Rhythm: Normal rate.  Pulmonary:     Effort: Pulmonary effort is normal.  Neurological:     Mental Status: She is alert.  Psychiatric:        Mood and Affect: Mood normal.        Behavior: Behavior normal.      UC Treatments / Results  Labs (all labs ordered are listed, but only abnormal results are displayed) Labs Reviewed - No data to  display  EKG   Radiology No results  found.  Procedures Procedures (including critical care time)  Medications Ordered in UC Medications - No data to display  Initial Impression / Assessment and Plan / UC Course  I have reviewed the triage vital signs and the nursing notes.  Pertinent labs & imaging results that were available during my care of the patient were reviewed by me and considered in my medical decision making (see chart for details).    Polytrim prescribed to treat most likely conjunctivitis. Advised against use of contacts while she is being treated. Patient expresses understanding. Recommend further evaluation in the ED with any worsening symptoms for tonometry.   Final Clinical Impressions(s) / UC Diagnoses   Final diagnoses:  Acute conjunctivitis of right eye, unspecified acute conjunctivitis type   Discharge Instructions   None    ED Prescriptions     Medication Sig Dispense Auth. Provider   trimethoprim-polymyxin b (POLYTRIM) ophthalmic solution Place 1 drop into the right eye every 4 (four) hours for 7 days. 10 mL Tomi Bamberger, PA-C      PDMP not reviewed this encounter.   Tomi Bamberger, PA-C 08/23/22 1334

## 2022-08-25 ENCOUNTER — Telehealth (HOSPITAL_COMMUNITY): Payer: Self-pay | Admitting: Physician Assistant

## 2022-08-25 ENCOUNTER — Other Ambulatory Visit (HOSPITAL_COMMUNITY): Payer: Self-pay

## 2022-08-25 MED ORDER — MOXIFLOXACIN HCL 0.5 % OP SOLN
1.0000 [drp] | Freq: Three times a day (TID) | OPHTHALMIC | 0 refills | Status: DC
  Filled 2022-08-25: qty 3, 20d supply, fill #0

## 2022-08-25 NOTE — Telephone Encounter (Signed)
Patient notified provider that she is continuing to have eye irritation that seems to be worsening today after being prescribed antibiotic eye drops 2 days ago for suspected conjunctivitis. She reports that she has had more photophobia. Discussed treatment options-- together decided to try another antibiotic eye drop and if no improvement in the morning will seek care from ophthalmology as there is concern for possible corneal ulceration/ keratitis given worsening symptoms and patient history of recent contact use.

## 2022-08-26 ENCOUNTER — Other Ambulatory Visit (HOSPITAL_COMMUNITY): Payer: Self-pay

## 2022-09-02 ENCOUNTER — Other Ambulatory Visit (HOSPITAL_COMMUNITY)
Admission: RE | Admit: 2022-09-02 | Discharge: 2022-09-02 | Disposition: A | Discharge status: Home / Self Care | Payer: No Typology Code available for payment source | Source: Ambulatory Visit | Attending: Family Medicine | Admitting: Family Medicine

## 2022-09-02 ENCOUNTER — Encounter: Payer: Self-pay | Admitting: Family Medicine

## 2022-09-02 ENCOUNTER — Ambulatory Visit (INDEPENDENT_AMBULATORY_CARE_PROVIDER_SITE_OTHER): Payer: No Typology Code available for payment source | Admitting: Family Medicine

## 2022-09-02 VITALS — BP 118/70 | HR 81 | Temp 97.6°F | Resp 16 | Ht 62.0 in | Wt 101.4 lb

## 2022-09-02 DIAGNOSIS — R3915 Urgency of urination: Secondary | ICD-10-CM | POA: Diagnosis not present

## 2022-09-02 DIAGNOSIS — I10 Essential (primary) hypertension: Secondary | ICD-10-CM

## 2022-09-02 DIAGNOSIS — Z202 Contact with and (suspected) exposure to infections with a predominantly sexual mode of transmission: Secondary | ICD-10-CM | POA: Insufficient documentation

## 2022-09-02 DIAGNOSIS — Z0000 Encounter for general adult medical examination without abnormal findings: Secondary | ICD-10-CM

## 2022-09-02 DIAGNOSIS — E559 Vitamin D deficiency, unspecified: Secondary | ICD-10-CM

## 2022-09-02 HISTORY — DX: Encounter for general adult medical examination without abnormal findings: Z00.00

## 2022-09-02 LAB — HEPATIC FUNCTION PANEL
ALT: 10 U/L (ref 0–35)
AST: 13 U/L (ref 0–37)
Albumin: 4 g/dL (ref 3.5–5.2)
Alkaline Phosphatase: 44 U/L (ref 39–117)
Bilirubin, Direct: 0.1 mg/dL (ref 0.0–0.3)
Total Bilirubin: 0.5 mg/dL (ref 0.2–1.2)
Total Protein: 7.7 g/dL (ref 6.0–8.3)

## 2022-09-02 LAB — POCT URINALYSIS DIPSTICK
Glucose, UA: NEGATIVE
Leukocytes, UA: NEGATIVE
Protein, UA: POSITIVE — AB
Spec Grav, UA: 1.015 (ref 1.010–1.025)
Urobilinogen, UA: 0.2 E.U./dL
pH, UA: 7 (ref 5.0–8.0)

## 2022-09-02 LAB — LIPID PANEL
Cholesterol: 155 mg/dL (ref 0–200)
HDL: 68.7 mg/dL (ref >39.00)
LDL Cholesterol: 78 mg/dL (ref 0–99)
NonHDL: 86.44
Total CHOL/HDL Ratio: 2
Triglycerides: 41 mg/dL (ref 0.0–149.0)
VLDL: 8.2 mg/dL (ref 0.0–40.0)

## 2022-09-02 LAB — CBC WITH DIFFERENTIAL/PLATELET
Basophils Absolute: 0 10*3/uL (ref 0.0–0.1)
Basophils Relative: 0.9 % (ref 0.0–3.0)
Eosinophils Absolute: 0.1 10*3/uL (ref 0.0–0.7)
Eosinophils Relative: 2.2 % (ref 0.0–5.0)
HCT: 35.2 % — ABNORMAL LOW (ref 36.0–46.0)
Hemoglobin: 11.3 g/dL — ABNORMAL LOW (ref 12.0–15.0)
Lymphocytes Relative: 32 % (ref 12.0–46.0)
Lymphs Abs: 1.5 10*3/uL (ref 0.7–4.0)
MCHC: 32 g/dL (ref 30.0–36.0)
MCV: 79.2 fl (ref 78.0–100.0)
Monocytes Absolute: 0.6 10*3/uL (ref 0.1–1.0)
Monocytes Relative: 13.6 % — ABNORMAL HIGH (ref 3.0–12.0)
Neutro Abs: 2.3 10*3/uL (ref 1.4–7.7)
Neutrophils Relative %: 51.3 % (ref 43.0–77.0)
Platelets: 249 10*3/uL (ref 150.0–400.0)
RBC: 4.45 Mil/uL (ref 3.87–5.11)
RDW: 14.1 % (ref 11.5–15.5)
WBC: 4.6 10*3/uL (ref 4.0–10.5)

## 2022-09-02 LAB — VITAMIN D 25 HYDROXY (VIT D DEFICIENCY, FRACTURES): VITD: 21.77 ng/mL — ABNORMAL LOW (ref 30.00–100.00)

## 2022-09-02 LAB — BASIC METABOLIC PANEL
BUN: 10 mg/dL (ref 6–23)
CO2: 29 mEq/L (ref 19–32)
Calcium: 9.3 mg/dL (ref 8.4–10.5)
Chloride: 102 mEq/L (ref 96–112)
Creatinine, Ser: 0.72 mg/dL (ref 0.40–1.20)
GFR: 108.62 mL/min (ref >60.00)
Glucose, Bld: 77 mg/dL (ref 70–99)
Potassium: 4.1 mEq/L (ref 3.5–5.1)
Sodium: 137 mEq/L (ref 135–145)

## 2022-09-02 LAB — TSH: TSH: 1.48 u[IU]/mL (ref 0.35–5.50)

## 2022-09-02 NOTE — Progress Notes (Signed)
Pt seen results Via my chart  

## 2022-09-02 NOTE — Patient Instructions (Signed)
Please establish with a new provider so there isn't a lapse in your care We'll notify you of your lab results and make any changes if needed Make sure you are eating regularly! Call with any questions or concerns Stay Safe!  Stay Healthy!

## 2022-09-02 NOTE — Assessment & Plan Note (Signed)
Check labs and replete prn. 

## 2022-09-02 NOTE — Assessment & Plan Note (Signed)
Pt's PE WNL.  UTD on pap and Tdap.  Will get flu shot at work.  Encouraged her to eat regularly to avoid losing any more weight.  Check labs.  Anticipatory guidance provided.

## 2022-09-02 NOTE — Assessment & Plan Note (Signed)
Chronic problem.  Excellent control today.  Currently asymptomatic.  Check labs but no anticipated med changes. 

## 2022-09-02 NOTE — Progress Notes (Signed)
   Subjective:    Patient ID: Rachael Sherman, female    DOB: April 09, 1986, 36 y.o.   MRN: [id number]  HPI CPE- UTD on pap, Tdap.  Will get flu at work.  Patient Care Team    Relationship Specialty Notifications Start End  Janeece Agee, NP PCP - General Adult Health Nurse Practitioner  03/15/22   Debera Lat, MD PCP - Cardiology Family Medicine Admissions 05/23/20     Health Maintenance  Topic Date Due   INFLUENZA VACCINE  03/27/2023 (Originally 07/27/2022)   PAP SMEAR-Modifier  08/03/2025   TETANUS/TDAP  03/10/2027   HIV Screening  Completed   HPV VACCINES  Aged Out   Hepatitis C Screening  Discontinued      Review of Systems Patient reports no vision/ hearing changes, adenopathy,fever, weight change,  persistant/recurrent hoarseness , swallowing issues, chest pain, palpitations, edema, persistant/recurrent cough, hemoptysis, dyspnea (rest/exertional/paroxysmal nocturnal), gastrointestinal bleeding (melena, rectal bleeding), abdominal pain, significant heartburn, bowel changes, Gyn symptoms (abnormal  bleeding, pain),  syncope, focal weakness, memory loss, numbness & tingling, skin/hair/nail changes, abnormal bruising or bleeding, anxiety, or depression.   Urinary urgency- sxs started ~1 week ago.  Denies frequency, dysuria.  No suprapubic pressure    Objective:   Physical Exam General Appearance:    Alert, cooperative, no distress, appears stated age  Head:    Normocephalic, without obvious abnormality, atraumatic  Eyes:    PERRL, conjunctiva/corneas clear, EOM's intact both eyes  Ears:    Normal TM's and external ear canals, both ears  Nose:   Nares normal, septum midline, mucosa normal, no drainage    or sinus tenderness  Throat:   Lips, mucosa, and tongue normal; teeth and gums normal  Neck:   Supple, symmetrical, trachea midline, no adenopathy;    Thyroid: no enlargement/tenderness/nodules  Back:     Symmetric, no curvature, ROM normal, no CVA tenderness  Lungs:      Clear to auscultation bilaterally, respirations unlabored  Chest Wall:    No tenderness or deformity   Heart:    Regular rate and rhythm, S1 and S2 normal, no murmur, rub   or gallop  Breast Exam:    Deferred to GYN  Abdomen:     Soft, non-tender, bowel sounds active all four quadrants,    no masses, no organomegaly  Genitalia:    Deferred to GYN  Rectal:    Extremities:   Extremities normal, atraumatic, no cyanosis or edema  Pulses:   2+ and symmetric all extremities  Skin:   Skin color, texture, turgor normal, no rashes or lesions  Lymph nodes:   Cervical, supraclavicular, and axillary nodes normal  Neurologic:   CNII-XII intact, normal strength, sensation and reflexes    throughout          Assessment & Plan:

## 2022-09-06 ENCOUNTER — Telehealth: Payer: Self-pay

## 2022-09-06 LAB — URINE CYTOLOGY ANCILLARY ONLY
Bacterial Vaginitis-Urine: NEGATIVE
Candida Urine: NEGATIVE
Chlamydia: NEGATIVE
Comment: NEGATIVE
Comment: NEGATIVE
Comment: NORMAL
Neisseria Gonorrhea: NEGATIVE
Trichomonas: NEGATIVE

## 2022-09-06 MED ORDER — VITAMIN D (ERGOCALCIFEROL) 1.25 MG (50000 UNIT) PO CAPS: 50000.0000 [IU] | ORAL_CAPSULE | ORAL | 0 refills | Status: DC

## 2022-09-06 NOTE — Telephone Encounter (Signed)
-----   Message from Sheliah Hatch, MD sent at 09/06/2022  7:30 AM EDT ----- Labs look great w/ exception of low Vit D.  Based on this, we need to start prescription 50,000 units weekly x12 weeks in addition to daily OTC supplement of at least 2000 units.

## 2022-09-06 NOTE — Telephone Encounter (Signed)
Sent Vitamin D

## 2022-09-08 ENCOUNTER — Telehealth: Payer: Self-pay | Admitting: *Deleted

## 2022-09-08 NOTE — Telephone Encounter (Signed)
Patient called concerned her cycle is 1 week late, history of tubal ligation, negative upt. Patient reports this has never happened before.  Do you want her to schedule office visit? Come in for blood test? Please advise

## 2022-09-08 NOTE — Telephone Encounter (Signed)
Left message for patient to call.

## 2022-09-08 NOTE — Telephone Encounter (Signed)
Did she start [place]? If so, it is not uncommon to not have bleeding while taking.

## 2022-09-09 NOTE — Telephone Encounter (Signed)
Patient informed. 

## 2022-09-09 NOTE — Telephone Encounter (Signed)
Patient said she has been taking [place] for 2 months now, LMP:08/09/22. I did inform her with the below.

## 2022-09-09 NOTE — Telephone Encounter (Signed)
Yes, this is normal while taking [place].

## 2022-09-09 NOTE — Telephone Encounter (Signed)
Her missed menses are likely from taking and stopping the [place]. I would just monitor for now. Please reassure her that it takes 30-60 days of consistent use for best results with Dewayne Hatch.

## 2022-09-09 NOTE — Telephone Encounter (Signed)
I relayed the below to patient and she reports she wasn't taking it consistently because it was not working. She reports taking medication for 2-3 weeks and stopped Rx.

## 2022-09-15 ENCOUNTER — Encounter: Payer: No Typology Code available for payment source | Admitting: Registered Nurse

## 2022-09-20 ENCOUNTER — Encounter: Payer: Self-pay | Admitting: Nurse Practitioner

## 2022-09-20 ENCOUNTER — Emergency Department (HOSPITAL_BASED_OUTPATIENT_CLINIC_OR_DEPARTMENT_OTHER)
Admission: EM | Admit: 2022-09-20 | Discharge: 2022-09-21 | Disposition: A | Discharge status: Home / Self Care | Payer: No Typology Code available for payment source | Attending: Emergency Medicine | Admitting: Emergency Medicine

## 2022-09-20 ENCOUNTER — Emergency Department (HOSPITAL_BASED_OUTPATIENT_CLINIC_OR_DEPARTMENT_OTHER): Payer: No Typology Code available for payment source

## 2022-09-20 ENCOUNTER — Other Ambulatory Visit: Payer: Self-pay

## 2022-09-20 ENCOUNTER — Encounter (HOSPITAL_BASED_OUTPATIENT_CLINIC_OR_DEPARTMENT_OTHER): Payer: Self-pay | Admitting: Emergency Medicine

## 2022-09-20 DIAGNOSIS — N926 Irregular menstruation, unspecified: Secondary | ICD-10-CM | POA: Diagnosis not present

## 2022-09-20 DIAGNOSIS — R102 Pelvic and perineal pain: Secondary | ICD-10-CM

## 2022-09-20 DIAGNOSIS — I10 Essential (primary) hypertension: Secondary | ICD-10-CM | POA: Insufficient documentation

## 2022-09-20 LAB — WET PREP, GENITAL
Sperm: NONE SEEN
Trich, Wet Prep: NONE SEEN
WBC, Wet Prep HPF POC: <10 (ref <10)
Yeast Wet Prep HPF POC: NONE SEEN

## 2022-09-20 LAB — PREGNANCY, URINE: Preg Test, Ur: NEGATIVE

## 2022-09-20 NOTE — ED Provider Notes (Signed)
[facility] Provider Note: Georgena Spurling, MD, FACEP  CSN: [id number] MRN: [id number] ARRIVAL: 09/20/22 at 2047 ROOM: [place]  Vaginal Bleeding   HISTORY OF PRESENT ILLNESS  09/20/22 11:13 PM Rachael Sherman is a 36 y.o. female who has had vaginal bleeding and left pelvic pain since yesterday.  She states this vaginal bleeding comes 16 days late for her usual period.  Bleeding is as heavy as a period.  She rates the pelvic pain as a 6 out of 10 with both sharp and dull components.  She got some relief with Tylenol about 7 PM.  She has had nausea for about 3 weeks.   Past Medical History:  Diagnosis Date   Allergy    Anxiety    Endometriosis    Hypertension    [employer] 11/25/2020    Past Surgical History:  Procedure Laterality Date   BREAST EXCISIONAL BIOPSY Left    fibroadenoma   BREAST SURGERY     tumor (benign) left breast removal    CESAREAN SECTION     2x   laproscopic     endometriosis   TUBAL LIGATION N/A    [employer] 11/25/2020    Family History  Adopted: Yes  Problem Relation Age of Onset   Hypertension Mother    Healthy Mother    Drug abuse Mother    Hypertension Father    Healthy Father    Anxiety disorder Brother    Diabetes Maternal Grandmother     Social History   Tobacco Use   Smoking status: Never   Smokeless tobacco: Never  Vaping Use   Vaping Use: Never used  Substance Use Topics   Alcohol use: Yes    Comment: socially   Drug use: No    Prior to Admission medications   Medication Sig Start Date End Date Taking? Authorizing Provider  amitriptyline (ELAVIL) 10 MG tablet TAKE 1 TABLET (10 MG TOTAL) BY MOUTH AT BEDTIME. 11/25/20 11/25/21  Mayers, Cari S, PA-C  amlodipine-olmesartan (AZOR) 10-20 MG tablet Take 1 tablet by mouth daily. 03/15/22   Maximiano Coss, NP  Elagolix Sodium (ORILISSA) 200 MG TABS Take 1 tablet by mouth in the morning and at bedtime. 08/03/22   Tamela Gammon, NP  fluticasone  (FLONASE) 50 MCG/ACT nasal spray Place 2 sprays into both nostrils daily. 06/03/22   Maximiano Coss, NP  hydrOXYzine (ATARAX) 25 MG tablet Take 1 tablet (25 mg total) by mouth 3 (three) times daily as needed. 06/03/22   Maximiano Coss, NP  ibuprofen (ADVIL) 600 MG tablet Take 1 tablet (600 mg total) by mouth every 8 (eight) hours as needed for up to 30 doses for fever, headache, mild pain or moderate pain (Inflammation). Take 1 tablet 3 times daily as needed for inflammation of upper airways and/or pain. 05/08/22   Lynden Oxford Scales, PA-C  moxifloxacin (VIGAMOX) 0.5 % ophthalmic solution Place 1 drop into the right eye 3 (three) times daily for 7 days 08/25/22   Francene Finders, PA-C  ondansetron (ZOFRAN-ODT) 4 MG disintegrating tablet Take 1 tablet (4 mg total) by mouth every 8 (eight) hours as needed for nausea or vomiting. 01/09/22   Vanessa Kick, MD  SUMAtriptan (IMITREX) 50 MG tablet Take 1 tablet (50 mg total) by mouth every 2 (two) hours as needed for migraine. May repeat in 2 hours if headache persists or recurs. 06/03/22   Maximiano Coss, NP  triamcinolone ointment (KENALOG) 0.5 % Apply 1 application topically 2 (two) times  daily. 08/03/22   Marny Lowenstein A, NP  Ubrogepant 50 MG TABS Take 1 tablet by mouth daily as needed (severe migraine). 06/03/22   Maximiano Coss, NP  Vitamin D, Ergocalciferol, (DRISDOL) 1.25 MG (50000 UNIT) CAPS capsule Take 1 capsule (50,000 Units total) by mouth every 7 (seven) days. 09/06/22   Midge Minium, MD    Allergies Patient has no known allergies.   REVIEW OF SYSTEMS  Negative except as noted here or in the History of Present Illness.   PHYSICAL EXAMINATION  Initial Vital Signs Blood pressure 129/87, pulse 76, temperature 97.9 F (36.6 C), resp. rate (!) 22, last menstrual period 08/09/2022, SpO2 100 %.  Examination General: Well-developed, well-nourished female in no acute distress; appearance consistent with age of record HENT: normocephalic;  atraumatic Eyes: Normal appearance Neck: supple Heart: regular rate and rhythm Lungs: clear to auscultation bilaterally Abdomen: soft; nondistended; left suprapubic tenderness; bowel sounds present GU: Normal external genitalia; heavy vaginal bleeding; no cervical motion tenderness; left adnexal tenderness Extremities: No deformity; full range of motion; pulses normal Neurologic: Awake, alert and oriented; motor function intact in all extremities and symmetric; no facial droop Skin: Warm and dry Psychiatric: Normal mood and affect   RESULTS  Summary of this visit's results, reviewed and interpreted by myself:   EKG Interpretation  Date/Time:    Ventricular Rate:    PR Interval:    QRS Duration:   QT Interval:    QTC Calculation:   R Axis:     Text Interpretation:         Laboratory Studies: Results for orders placed or performed during the hospital encounter of 09/20/22 (from the past 24 hour(s))  Pregnancy, urine     Status: None   Collection Time: 09/20/22  9:41 PM  Result Value Ref Range   Preg Test, Ur NEGATIVE NEGATIVE   Imaging Studies: No results found.  ED COURSE and MDM  Nursing notes, initial and subsequent vitals signs, including pulse oximetry, reviewed and interpreted by myself.  Vitals:   09/20/22 2121  BP: 129/87  Pulse: 76  Resp: (!) 22  Temp: 97.9 F (36.6 C)  SpO2: 100%   Medications - No data to display    PROCEDURES  Procedures   ED DIAGNOSES  No diagnosis found.

## 2022-09-20 NOTE — ED Triage Notes (Signed)
Pain on left lower flank, and abnormal menstrual bleeding. Last menstrual 08/09/2022  Nausea started about 3 weeks ago Took tylenol at 7pm with some relief of pain

## 2022-09-21 ENCOUNTER — Other Ambulatory Visit (HOSPITAL_COMMUNITY): Payer: Self-pay

## 2022-09-21 ENCOUNTER — Telehealth: Payer: Self-pay | Admitting: *Deleted

## 2022-09-21 ENCOUNTER — Telehealth (HOSPITAL_BASED_OUTPATIENT_CLINIC_OR_DEPARTMENT_OTHER): Payer: Self-pay | Admitting: Obstetrics & Gynecology

## 2022-09-21 MED ORDER — NAPROXEN 250 MG PO TABS
500.0000 mg | ORAL_TABLET | Freq: Once | ORAL | Status: AC
  Administered 2022-09-21: 500 mg via ORAL
  Filled 2022-09-21: qty 2

## 2022-09-21 MED ORDER — HYDROCODONE-ACETAMINOPHEN 5-325 MG PO TABS
1.0000 | ORAL_TABLET | ORAL | 0 refills | Status: DC | PRN
  Filled 2022-09-21: qty 8, 2d supply, fill #0

## 2022-09-21 NOTE — Telephone Encounter (Signed)
36 yo AA female called on call provider around with concerns of cycle being 18 days late.  Bleeding started on Sunday evening and was heavy.  Typically her cycles are about 3 days and light.  She reports never having a late cycle.  Has done 4 UPTs and all are negative.  Still concerned about pregnancy and why cycle is late.  She is filling a maxi pad but taking a few to several hours.  Denies SOB, dizziness, palpitations.  No recent anemia.  Having LLQ pain as well with the cramping that is occurring.  She has taken Tylenol which has helped.  Felt pt could follow up in office as heavy bleeding would be expected with 18 day late cycle and she is not having excessively heavy bleeding nor any symptoms of anemia, hypovolemia.  Bleeding and pain precautions given and to seek care in ER if bleeding worsens or pain worsens.  Pt voiced understanding.

## 2022-09-21 NOTE — ED Notes (Signed)
Pt verbalizes understanding of discharge instructions. Opportunity for questioning and answers were provided. Pt discharged from ED to home.   ? ?

## 2022-09-21 NOTE — Telephone Encounter (Signed)
Call placed to patient to schedule ER f/u apt.   Left message to call Sharee Pimple, RN at [place] [facility], [phone], OPT 5.

## 2022-09-22 LAB — GC/CHLAMYDIA PROBE AMP (~~LOC~~) NOT AT ARMC
Chlamydia: NEGATIVE
Comment: NEGATIVE
Comment: NORMAL
Neisseria Gonorrhea: NEGATIVE

## 2022-09-22 NOTE — Telephone Encounter (Signed)
Left message to call Neve Branscomb, RN at [facility], [phone].  

## 2022-09-24 ENCOUNTER — Ambulatory Visit
Admission: EM | Admit: 2022-09-24 | Discharge: 2022-09-24 | Disposition: A | Discharge status: Home / Self Care | Payer: No Typology Code available for payment source | Attending: Physician Assistant | Admitting: Physician Assistant

## 2022-09-24 DIAGNOSIS — N926 Irregular menstruation, unspecified: Secondary | ICD-10-CM

## 2022-09-24 NOTE — ED Provider Notes (Signed)
Patient here for labs after being unable to obtain at ED recently.    Francene Finders, PA-C 09/24/22 1021

## 2022-09-24 NOTE — ED Triage Notes (Signed)
Pt states she is here for blood work.

## 2022-09-25 LAB — BETA HCG QUANT (REF LAB): hCG Quant: <1 m[IU]/mL

## 2022-09-27 NOTE — Telephone Encounter (Signed)
Patient has not returned call in f/u to 9/26 after hours call.    Routing to [place], [employer].

## 2022-10-06 NOTE — Telephone Encounter (Signed)
Tiffany -ok to close encounter?

## 2022-10-06 NOTE — Telephone Encounter (Signed)
Yes, OK to close. Thanks.  

## 2022-11-03 ENCOUNTER — Other Ambulatory Visit (HOSPITAL_COMMUNITY): Payer: Self-pay

## 2022-11-03 ENCOUNTER — Other Ambulatory Visit: Payer: Self-pay | Admitting: Nurse Practitioner

## 2022-11-03 DIAGNOSIS — L309 Dermatitis, unspecified: Secondary | ICD-10-CM

## 2022-11-03 MED ORDER — TRIAMCINOLONE ACETONIDE 0.5 % EX OINT
1.0000 | TOPICAL_OINTMENT | Freq: Two times a day (BID) | CUTANEOUS | 0 refills | Status: DC
  Filled 2022-11-03: qty 30, 15d supply, fill #0

## 2022-11-03 NOTE — Telephone Encounter (Signed)
Left message to call  [facility] Triage, [phone], OPT 4 in regard to refill request received for Kenalog ointment. Return call to provide update on symptoms.   Per review of EPIC, Kenalog ointment prescribed 08/03/22 for nipple dermatitis.    Routing to [employer], NP to review request

## 2022-11-12 ENCOUNTER — Other Ambulatory Visit (HOSPITAL_COMMUNITY): Payer: Self-pay

## 2023-01-12 ENCOUNTER — Ambulatory Visit: Payer: 59 | Admitting: Nurse Practitioner

## 2023-01-13 ENCOUNTER — Ambulatory Visit: Payer: 59 | Admitting: Nurse Practitioner

## 2023-01-17 ENCOUNTER — Encounter: Payer: Self-pay | Admitting: Nurse Practitioner

## 2023-01-17 ENCOUNTER — Ambulatory Visit: Payer: 59 | Admitting: Nurse Practitioner

## 2023-01-27 ENCOUNTER — Other Ambulatory Visit: Payer: Self-pay

## 2023-01-27 NOTE — Telephone Encounter (Signed)
Prior Auth sent through covermymeds.  Key [id number]. For [place].  Waiting response.

## 2023-02-28 ENCOUNTER — Telehealth: Payer: 59 | Admitting: Physician Assistant

## 2023-02-28 DIAGNOSIS — H1030 Unspecified acute conjunctivitis, unspecified eye: Secondary | ICD-10-CM

## 2023-02-28 DIAGNOSIS — M545 Low back pain, unspecified: Secondary | ICD-10-CM

## 2023-02-28 NOTE — Progress Notes (Signed)
   Thank you for the details you included in the comment boxes. Those details are very helpful in determining the best course of treatment for you and help [place] to provide the best care.Because you are having multiple issues (back pain and eye symptoms), we recommend that you convert this visit to a video visit in order for the provider to better assess what is going on.  The provider will be able to give you a more accurate diagnosis and treatment plan if we can more freely discuss your symptoms and with the addition of a virtual examination.   If you convert to a video visit, we will bill your insurance (similar to an office visit) and you will not be charged for this e-Visit. You will be able to stay at home and speak with the first available [facility] advanced practice provider. The link to do a video visit is in the drop down Menu tab of your Welcome screen in [place].  You will need to schedule the video visit. You can do this through one of two ways:  1) Go into your MyChart App and select the "Menu" button, then select the "Virtual Urgent Care Visit" then proceed scheduling -OR- 2) Go to [email or web address] and select "Get Started" under the Virtual Urgent Care option, select "View all options", then select the "Schedule on your Time" and proceed with scheduling.  Best Regards,  Grace Bushy, PA-C  I have spent 5 minutes in review of e-visit questionnaire, review and updating patient chart, medical decision making and response to patient.   Mar Daring, PA-C

## 2023-02-28 NOTE — Progress Notes (Signed)
Thank you for the details you included in the comment boxes. Those details are very helpful in determining the best course of treatment for you and help [place] to provide the best care.Because you are having multiple issues (back pain and eye symptoms), we recommend that you convert this visit to a video visit in order for the provider to better assess what is going on.  The provider will be able to give you a more accurate diagnosis and treatment plan if we can more freely discuss your symptoms and with the addition of a virtual examination.   If you convert to a video visit, we will bill your insurance (similar to an office visit) and you will not be charged for this e-Visit. You will be able to stay at home and speak with the first available [facility] advanced practice provider. The link to do a video visit is in the drop down Menu tab of your Welcome screen in [place].  You will need to schedule the video visit. You can do this through one of two ways:  1) Go into your MyChart App and select the "Menu" button, then select the "Virtual Urgent Care Visit" then proceed scheduling -OR- 2) Go to [email or web address] and select "Get Started" under the Virtual Urgent Care option, select "View all options", then select the "Schedule on your Time" and proceed with scheduling.  Best Regards,  Grace Bushy, PA-C  I have spent 5 minutes in review of e-visit questionnaire, review and updating patient chart, medical decision making and response to patient.   Mar Daring, PA-C

## 2023-03-02 ENCOUNTER — Telehealth: Payer: 59 | Admitting: Physician Assistant

## 2023-03-02 DIAGNOSIS — R3989 Other symptoms and signs involving the genitourinary system: Secondary | ICD-10-CM

## 2023-03-02 MED ORDER — CEPHALEXIN 500 MG PO CAPS: 500.0000 mg | ORAL_CAPSULE | Freq: Two times a day (BID) | ORAL | 0 refills | Status: DC

## 2023-03-02 NOTE — Progress Notes (Signed)

## 2023-04-25 ENCOUNTER — Ambulatory Visit: Payer: 59 | Admitting: Family Medicine

## 2023-04-29 ENCOUNTER — Other Ambulatory Visit (HOSPITAL_COMMUNITY)
Admission: RE | Admit: 2023-04-29 | Discharge: 2023-04-29 | Disposition: A | Discharge status: Home / Self Care | Payer: 59 | Source: Ambulatory Visit | Attending: Family Medicine | Admitting: Family Medicine

## 2023-04-29 ENCOUNTER — Encounter: Payer: Self-pay | Admitting: Family Medicine

## 2023-04-29 ENCOUNTER — Ambulatory Visit (INDEPENDENT_AMBULATORY_CARE_PROVIDER_SITE_OTHER): Payer: 59 | Admitting: Family Medicine

## 2023-04-29 DIAGNOSIS — R350 Frequency of micturition: Secondary | ICD-10-CM

## 2023-04-29 LAB — POCT URINALYSIS DIPSTICK
Bilirubin, UA: POSITIVE
Leukocytes, UA: NEGATIVE
Protein, UA: NEGATIVE
Spec Grav, UA: >=1.03 — AB (ref 1.010–1.025)
Urobilinogen, UA: 0.2 E.U./dL
pH, UA: 6 (ref 5.0–8.0)

## 2023-04-29 MED ORDER — CEPHALEXIN 500 MG PO CAPS: 500.0000 mg | ORAL_CAPSULE | Freq: Two times a day (BID) | ORAL | 0 refills | Status: AC

## 2023-04-29 NOTE — Progress Notes (Signed)
   Subjective:    Patient ID: Rachael Sherman, female    DOB: 11-11-1986, 37 y.o.   MRN: [id number]  HPI Urinary frequency- sxs first started 'a couple of weeks ago'.  Going more frequently but quantity is less.  Feels that she is not emptying bladder completely.  + cloudy, odor.  No dysuria.  No blood in urine.  No fevers.  Pt reports this feels similar to previous infxns   Review of Systems For ROS see HPI     Objective:   Physical Exam Vitals reviewed.  Constitutional:      Appearance: Normal appearance.  HENT:     Head: Normocephalic and atraumatic.  Cardiovascular:     Rate and Rhythm: Normal rate.  Pulmonary:     Effort: Pulmonary effort is normal. No respiratory distress.  Abdominal:     General: There is no distension.     Palpations: Abdomen is soft.     Tenderness: There is no abdominal tenderness. There is no right CVA tenderness, left CVA tenderness or guarding.  Skin:    General: Skin is warm and dry.  Neurological:     General: No focal deficit present.     Mental Status: She is alert and oriented to person, place, and time.  Psychiatric:        Mood and Affect: Mood normal.        Behavior: Behavior normal.        Thought Content: Thought content normal.           Assessment & Plan:  Urinary frequency- new.  Pt reports this feels similar to previous infxns.  Despite normal UA will tx w/ Keflex for likely infxn.  Pt is asking for urine cytology as well but declines HIV/RPR.  Will follow.

## 2023-04-29 NOTE — Patient Instructions (Signed)
Follow up as needed or as scheduled We'll notify you of your urine culture and lab results and make any changes if needed START the Cephalexin twice daily Drink LOTS of water Call with any questions or concerns Have a great weekend!!!

## 2023-04-30 LAB — URINE CULTURE
MICRO NUMBER:: 14910902
Result:: NO GROWTH
SPECIMEN QUALITY:: ADEQUATE

## 2023-05-02 ENCOUNTER — Telehealth: Payer: Self-pay

## 2023-05-02 LAB — URINE CYTOLOGY ANCILLARY ONLY
Bacterial Vaginitis-Urine: NEGATIVE
Candida Urine: NEGATIVE
Chlamydia: NEGATIVE
Comment: NEGATIVE
Comment: NEGATIVE
Comment: NORMAL
Neisseria Gonorrhea: NEGATIVE
Trichomonas: NEGATIVE

## 2023-05-02 NOTE — Telephone Encounter (Signed)
Pt aware of results 

## 2023-05-02 NOTE — Telephone Encounter (Signed)
Pt seen result Via my chart

## 2023-05-02 NOTE — Telephone Encounter (Signed)
-----   Message from Sheliah Hatch, MD sent at 05/02/2023  4:14 PM EDT ----- Urine is negative for sexually transmitted infections, yeast, or BV

## 2023-05-02 NOTE — Telephone Encounter (Signed)
-----   Message from Sheliah Hatch, MD sent at 05/02/2023  7:29 AM EDT ----- No evidence of infection.  How are you feeling?

## 2023-05-15 ENCOUNTER — Emergency Department (HOSPITAL_BASED_OUTPATIENT_CLINIC_OR_DEPARTMENT_OTHER)
Admission: EM | Admit: 2023-05-15 | Discharge: 2023-05-15 | Disposition: A | Discharge status: Home / Self Care | Payer: 59 | Attending: Emergency Medicine | Admitting: Emergency Medicine

## 2023-05-15 ENCOUNTER — Encounter (HOSPITAL_BASED_OUTPATIENT_CLINIC_OR_DEPARTMENT_OTHER): Payer: Self-pay

## 2023-05-15 ENCOUNTER — Other Ambulatory Visit: Payer: Self-pay

## 2023-05-15 ENCOUNTER — Emergency Department (HOSPITAL_BASED_OUTPATIENT_CLINIC_OR_DEPARTMENT_OTHER): Payer: 59

## 2023-05-15 DIAGNOSIS — R0602 Shortness of breath: Secondary | ICD-10-CM | POA: Diagnosis not present

## 2023-05-15 DIAGNOSIS — R059 Cough, unspecified: Secondary | ICD-10-CM | POA: Diagnosis not present

## 2023-05-15 DIAGNOSIS — R051 Acute cough: Secondary | ICD-10-CM | POA: Insufficient documentation

## 2023-05-15 DIAGNOSIS — I10 Essential (primary) hypertension: Secondary | ICD-10-CM | POA: Diagnosis not present

## 2023-05-15 DIAGNOSIS — Z79899 Other long term (current) drug therapy: Secondary | ICD-10-CM | POA: Insufficient documentation

## 2023-05-15 DIAGNOSIS — R0789 Other chest pain: Secondary | ICD-10-CM | POA: Diagnosis not present

## 2023-05-15 MED ORDER — ALBUTEROL SULFATE HFA 108 (90 BASE) MCG/ACT IN AERS: 2.0000 | INHALATION_SPRAY | RESPIRATORY_TRACT | Status: DC | PRN

## 2023-05-15 MED ORDER — BENZONATATE 100 MG PO CAPS
100.0000 mg | ORAL_CAPSULE | Freq: Once | ORAL | Status: AC
  Administered 2023-05-15: 100 mg via ORAL
  Filled 2023-05-15: qty 1

## 2023-05-15 MED ORDER — BENZONATATE 100 MG PO CAPS: 100.0000 mg | ORAL_CAPSULE | Freq: Three times a day (TID) | ORAL | 0 refills | Status: DC

## 2023-05-15 MED ORDER — PREDNISONE 20 MG PO TABS: 20.0000 mg | ORAL_TABLET | Freq: Every day | ORAL | 0 refills | Status: AC

## 2023-05-15 MED ORDER — PREDNISONE 50 MG PO TABS
60.0000 mg | ORAL_TABLET | Freq: Once | ORAL | Status: AC
  Administered 2023-05-15: 60 mg via ORAL
  Filled 2023-05-15: qty 1

## 2023-05-15 NOTE — ED Notes (Signed)
Pt ambulated without difficulty and O2 sat remained between 96%-98%.

## 2023-05-15 NOTE — ED Triage Notes (Signed)
Pt thinks she has bronchitis. No OTC meds are helping. Pt with cough x1 week. No recent fevers.

## 2023-05-15 NOTE — ED Provider Notes (Signed)
[place] [facility] Provider Note   CSN: [id number] Arrival date & time: 05/15/23  1901     History  Chief Complaint  Patient presents with   Cough    Rachael Sherman is a 37 y.o. female with a past medical history significant for hypertension, migraines, and endometriosis who presents to the ED due to persistent cough x 1 week.  No fever.  Admits to some chest tightness only while coughing.  Also endorses some shortness of breath worse with cough.  No history of blood clots, recent surgeries, recent long immobilizations.  No lower extremity edema.  Denies rhinorrhea, nasal congestion, headache, nausea, or vomiting.   History obtained from patient and past medical records. No interpreter used during encounter.       Home Medications Prior to Admission medications   Medication Sig Start Date End Date Taking? Authorizing Provider  benzonatate (TESSALON) 100 MG capsule Take 1 capsule (100 mg total) by mouth every 8 (eight) hours. 05/15/23  Yes Daune Colgate, Merla Riches, PA-C  predniSONE (DELTASONE) 20 MG tablet Take 1 tablet (20 mg total) by mouth daily for 5 days. 05/15/23 05/20/23 Yes Benito Lemmerman, Merla Riches, PA-C  amitriptyline (ELAVIL) 10 MG tablet TAKE 1 TABLET (10 MG TOTAL) BY MOUTH AT BEDTIME. 11/25/20 04/29/23  Mayers, Cari S, PA-C  amlodipine-olmesartan (AZOR) 10-20 MG tablet Take 1 tablet by mouth daily. 03/15/22   Janeece Agee, NP  hydrOXYzine (ATARAX) 25 MG tablet Take 1 tablet (25 mg total) by mouth 3 (three) times daily as needed. 06/03/22   Janeece Agee, NP  ibuprofen (ADVIL) 600 MG tablet Take 1 tablet (600 mg total) by mouth every 8 (eight) hours as needed for up to 30 doses for fever, headache, mild pain or moderate pain (Inflammation). Take 1 tablet 3 times daily as needed for inflammation of upper airways and/or pain. 05/08/22   Theadora Rama Scales, PA-C  ondansetron (ZOFRAN-ODT) 4 MG disintegrating tablet Take 1 tablet (4 mg total) by mouth  every 8 (eight) hours as needed for nausea or vomiting. 01/09/22   Mardella Layman, MD  SUMAtriptan (IMITREX) 50 MG tablet Take 1 tablet (50 mg total) by mouth every 2 (two) hours as needed for migraine. May repeat in 2 hours if headache persists or recurs. 06/03/22   Janeece Agee, NP  triamcinolone ointment (KENALOG) 0.5 % Apply 1 application topically 2 (two) times daily. 11/03/22   [name], NP      Allergies    Patient has no known allergies.    Review of Systems   Review of Systems  Constitutional:  Negative for chills and fever.  Respiratory:  Positive for cough and shortness of breath.   Cardiovascular:  Positive for chest pain. Negative for leg swelling.    Physical Exam Updated Vital Signs BP (!) 156/103   Pulse 76   Temp 98.2 F (36.8 C) (Oral)   Resp 20   Ht 5\' 2"  (1.575 m)   Wt 49.9 kg   LMP 05/07/2023   SpO2 100%   BMI 20.12 kg/m  Physical Exam Vitals and nursing note reviewed.  Constitutional:      General: She is not in acute distress.    Appearance: She is not ill-appearing.  HENT:     Head: Normocephalic.  Eyes:     Pupils: Pupils are equal, round, and reactive to light.  Cardiovascular:     Rate and Rhythm: Normal rate and regular rhythm.     Pulses: Normal pulses.  Heart sounds: Normal heart sounds. No murmur heard.    No friction rub. No gallop.  Pulmonary:     Effort: Pulmonary effort is normal.     Breath sounds: Normal breath sounds.     Comments: Respirations equal and unlabored, patient able to speak in full sentences, lungs clear to auscultation bilaterally Abdominal:     General: Abdomen is flat. There is no distension.     Palpations: Abdomen is soft.     Tenderness: There is no abdominal tenderness. There is no guarding or rebound.  Musculoskeletal:        General: Normal range of motion.     Cervical back: Neck supple.  Skin:    General: Skin is warm and dry.  Neurological:     General: No focal deficit present.      Mental Status: She is alert.  Psychiatric:        Mood and Affect: Mood normal.        Behavior: Behavior normal.     ED Results / Procedures / Treatments   Labs (all labs ordered are listed, but only abnormal results are displayed) Labs Reviewed - No data to display  EKG None  Radiology DG Chest 2 View  Result Date: 05/15/2023 CLINICAL DATA:  Cough for 1 week. EXAM: CHEST - 2 VIEW COMPARISON:  04/18/2019 chest radiograph FINDINGS: The cardiomediastinal silhouette is unremarkable. There is no evidence of focal airspace disease, pulmonary edema, suspicious pulmonary nodule/mass, pleural effusion, or pneumothorax. No acute bony abnormalities are identified. IMPRESSION: No active cardiopulmonary disease. Electronically Signed   By: Harmon Pier M.D.   On: 05/15/2023 20:11    Procedures Procedures    Medications Ordered in ED Medications  albuterol (VENTOLIN HFA) 108 (90 Base) MCG/ACT inhaler 2 puff (has no administration in time range)  benzonatate (TESSALON) capsule 100 mg (has no administration in time range)  predniSONE (DELTASONE) tablet 60 mg (has no administration in time range)    ED Course/ Medical Decision Making/ A&P                             Medical Decision Making Amount and/or Complexity of Data Reviewed Radiology: ordered and independent interpretation performed. Decision-making details documented in ED Course.  Risk Prescription drug management.   37 year old female presents to the ED due to persistent dry cough x 1 week.  Admits to some chest tightness only while coughing.  Also endorses some shortness of breath worse with cough.  No history of asthma or COPD.  History of hypertension.  No other symptoms.  Upon arrival, patient afebrile, not tachycardic or hypoxic.  Patient no acute distress.  Lungs clear to auscultation bilaterally.  No wheeze or rhonchi.  Speaking in full sentences.  No signs of respiratory distress.  Patient declined COVID test.  Suspect  viral etiology of cough.  Patient requesting steroids.  Patient given prednisone and Tessalon Perles here in the ED and discharged with same.  Chest x-ray personally reviewed and interpreted as negative for signs of pneumonia.  Low suspicion for PE/DVT.  EKG demonstrates normal sinus rhythm.  No signs of acute ischemia.  Low suspicion for ACS.  Patient able to ambulate in the ED and maintain O2 saturation above 95%. Suspect viral etiology. Strict ED precautions discussed with patient. Patient states understanding and agrees to plan. Patient discharged home in no acute distress and stable vitals  Has PCP       Final  Clinical Impression(s) / ED Diagnoses Final diagnoses:  Acute cough    Rx / DC Orders ED Discharge Orders          Ordered    benzonatate (TESSALON) 100 MG capsule  Every 8 hours        05/15/23 2058    predniSONE (DELTASONE) 20 MG tablet  Daily        05/15/23 2058              Jesusita Oka 05/15/23 2118    Rondel Baton, MD 05/16/23 1539

## 2023-05-15 NOTE — Discharge Instructions (Signed)
It was a pleasure taking care of you today.  As discussed, your chest x-ray did not show evidence of pneumonia.  I suspect you have a viral infection causing your cough.  Cough with a viral infection can last up to 4 weeks.  I am sending you home with 5 days of steroids and cough medication.  Take as prescribed.  Follow-up with PCP if symptoms do not improve over the next few days.  Return to the ER for new or worsening symptoms.

## 2023-09-05 ENCOUNTER — Other Ambulatory Visit (HOSPITAL_COMMUNITY): Payer: Self-pay

## 2023-09-05 ENCOUNTER — Telehealth: Payer: Self-pay | Admitting: Physician Assistant

## 2023-09-05 DIAGNOSIS — T3695XA Adverse effect of unspecified systemic antibiotic, initial encounter: Secondary | ICD-10-CM

## 2023-09-05 DIAGNOSIS — B379 Candidiasis, unspecified: Secondary | ICD-10-CM

## 2023-09-05 MED ORDER — FLUCONAZOLE 150 MG PO TABS
150.0000 mg | ORAL_TABLET | ORAL | 0 refills | Status: DC | PRN
  Filled 2023-09-05: qty 2, 6d supply, fill #0

## 2023-09-05 NOTE — Progress Notes (Signed)

## 2023-09-18 ENCOUNTER — Telehealth: Payer: Self-pay | Admitting: Family

## 2023-09-18 DIAGNOSIS — M546 Pain in thoracic spine: Secondary | ICD-10-CM

## 2023-09-18 MED ORDER — BACLOFEN 10 MG PO TABS
10.0000 mg | ORAL_TABLET | Freq: Three times a day (TID) | ORAL | 0 refills | Status: DC
  Filled 2023-09-18 – 2024-02-28 (×3): qty 30, 10d supply, fill #0

## 2023-09-18 MED ORDER — NAPROXEN 500 MG PO TABS
500.0000 mg | ORAL_TABLET | Freq: Two times a day (BID) | ORAL | 0 refills | Status: DC
  Filled 2023-09-18 – 2024-02-28 (×3): qty 30, 15d supply, fill #0

## 2023-09-18 NOTE — Progress Notes (Signed)

## 2023-09-19 ENCOUNTER — Other Ambulatory Visit (HOSPITAL_COMMUNITY): Payer: Self-pay

## 2023-09-19 ENCOUNTER — Other Ambulatory Visit: Payer: Self-pay

## 2023-09-29 ENCOUNTER — Other Ambulatory Visit (HOSPITAL_COMMUNITY): Payer: Self-pay

## 2023-10-20 ENCOUNTER — Telehealth: Payer: Medicaid Other | Admitting: Physician Assistant

## 2023-10-20 ENCOUNTER — Other Ambulatory Visit (HOSPITAL_COMMUNITY): Payer: Self-pay

## 2023-10-20 DIAGNOSIS — R3989 Other symptoms and signs involving the genitourinary system: Secondary | ICD-10-CM

## 2023-10-20 MED ORDER — CEPHALEXIN 500 MG PO CAPS: 500.0000 mg | ORAL_CAPSULE | Freq: Two times a day (BID) | ORAL | 0 refills | Status: AC

## 2023-10-20 NOTE — Progress Notes (Signed)
I have spent 5 minutes in review of e-visit questionnaire, review and updating patient chart, medical decision making and response to patient.   Mia Milan Cody Jacklynn Dehaas, PA-C    

## 2023-10-20 NOTE — Progress Notes (Signed)

## 2023-11-02 ENCOUNTER — Other Ambulatory Visit (HOSPITAL_COMMUNITY): Payer: Self-pay

## 2023-12-07 ENCOUNTER — Telehealth: Payer: Medicaid Other | Admitting: Physician Assistant

## 2023-12-07 DIAGNOSIS — B3731 Acute candidiasis of vulva and vagina: Secondary | ICD-10-CM | POA: Diagnosis not present

## 2023-12-08 ENCOUNTER — Other Ambulatory Visit (HOSPITAL_COMMUNITY): Payer: Self-pay

## 2023-12-08 ENCOUNTER — Telehealth: Payer: Medicaid Other | Admitting: Physician Assistant

## 2023-12-08 DIAGNOSIS — R300 Dysuria: Secondary | ICD-10-CM

## 2023-12-08 MED ORDER — FLUCONAZOLE 150 MG PO TABS
150.0000 mg | ORAL_TABLET | Freq: Every day | ORAL | 0 refills | Status: DC
  Filled 2023-12-08 – 2024-01-18 (×2): qty 2, 3d supply, fill #0
  Filled 2024-02-28: qty 2, 2d supply, fill #0

## 2023-12-08 NOTE — Progress Notes (Signed)
I have spent 5 minutes in review of e-visit questionnaire, review and updating patient chart, medical decision making and response to patient.   Rachael Milan Cody Jacklynn Dehaas, PA-C    

## 2023-12-08 NOTE — Progress Notes (Signed)

## 2023-12-08 NOTE — Progress Notes (Signed)
Because you submitted a visit after hours yesterday for concern of yeast with treatment started, but now having urinary symptoms and need to have testing to distinguish what is going on, I feel your condition warrants further evaluation and I recommend that you be seen in a face to face visit.   NOTE: There will be NO CHARGE for this eVisit   If you are having a true medical emergency please call 911.      For an urgent face to face visit, [facility] has eight urgent care centers for your convenience:   NEW!! [facility] Center at [facility] Get Driving Directions [phone] [address] [place], [address]    [facility] Health Urgent Care Center at [facility] Get Driving Directions [phone] [address] [place], [place] [address]   [facility] Health Urgent Care Center [facility]) Get Driving Directions [phone] [address] [place], [place] [address]  [facility] Health Urgent Care Center [facility]) Get Driving Directions [phone] [address] [place],  [place]  [address]  [facility] Health Urgent Care Center [facility] - at [employer]  [phone] [address] [place],  [place] [address]   [facility] Health Urgent Care at [facility] Get Driving Directions [phone] [address], [address] [place], [place] [address]   [facility] Health Urgent Care at [facility] Get Driving Directions  [phone] [address] [place], [place] [address]   [facility] Health Urgent Care at [facility] Directions [phone] [address], [address] [place], [place] [address]  Your MyChart E-visit questionnaire answers were reviewed by a board certified advanced clinical practitioner to complete your personal care plan based on your specific symptoms.  Thank you for using e-Visits.

## 2023-12-20 ENCOUNTER — Other Ambulatory Visit (HOSPITAL_COMMUNITY): Payer: Self-pay

## 2024-01-18 ENCOUNTER — Other Ambulatory Visit (HOSPITAL_COMMUNITY): Payer: Self-pay

## 2024-01-27 ENCOUNTER — Encounter: Payer: Medicaid Other | Admitting: Family Medicine

## 2024-01-27 ENCOUNTER — Telehealth: Payer: Self-pay | Admitting: Family Medicine

## 2024-01-27 NOTE — Telephone Encounter (Signed)
Copied from CRM [id number]. Topic: General - Other >> Jan 27, 2024 10:46 AM Gurney Maxin H wrote: Reason for CRM: Patient called in to check why her appointment for today with Dr. Guss Bunde was cancelled, reached out to [facility] to verify and provider isn't accepting new patients. Advised patient of reason and patient insisted she isn't a new patient to provider, explained to patient that TOC was never completed from Janeece Agee to Dr. Beverely Low. Patient insisting that she was told by Dr. Beverely Low that she would take her on as a patient, advised patient per notes provider informed patient that she would need to obtain a new provider on 09/02/2022 but no documentation that indicates provider was excepting patient through [facility]. Please reach out to patient for some clarity, thanks.  Arelis [phone]

## 2024-01-27 NOTE — Telephone Encounter (Signed)
Called patient and informed her and she will call back mid February to schedule with Dr Oswaldo Done

## 2024-01-27 NOTE — Telephone Encounter (Signed)
I know that Richard's patients were instructed to establish w/ new providers as my patient panel size does not allow for me to take on new patients at this time.  I would love to see everyone that needs a new doctor, but since there is only 1 of me, that is not possible.  I apologize that I do not remember a conversation about becoming a new patient- I typically document that in the AVS so staff is aware they can make an appt.  The only documentation I see in the AVS is to establish w/ a new care provider.

## 2024-01-30 ENCOUNTER — Other Ambulatory Visit (HOSPITAL_COMMUNITY): Payer: Self-pay

## 2024-01-31 ENCOUNTER — Encounter: Payer: Medicaid Other | Admitting: Family Medicine

## 2024-02-28 ENCOUNTER — Other Ambulatory Visit (HOSPITAL_COMMUNITY): Payer: Self-pay

## 2024-03-05 ENCOUNTER — Encounter: Payer: Self-pay | Admitting: Student in an Organized Health Care Education/Training Program

## 2024-03-05 ENCOUNTER — Ambulatory Visit: Admitting: Student in an Organized Health Care Education/Training Program

## 2024-03-05 VITALS — BP 112/68 | HR 61 | Temp 98.1°F | Wt 102.0 lb

## 2024-03-05 DIAGNOSIS — L309 Dermatitis, unspecified: Secondary | ICD-10-CM | POA: Diagnosis not present

## 2024-03-05 DIAGNOSIS — L2089 Other atopic dermatitis: Secondary | ICD-10-CM | POA: Diagnosis not present

## 2024-03-05 DIAGNOSIS — N809 Endometriosis, unspecified: Secondary | ICD-10-CM

## 2024-03-05 DIAGNOSIS — L209 Atopic dermatitis, unspecified: Secondary | ICD-10-CM | POA: Insufficient documentation

## 2024-03-05 DIAGNOSIS — Z0000 Encounter for general adult medical examination without abnormal findings: Secondary | ICD-10-CM

## 2024-03-05 MED ORDER — ONDANSETRON 4 MG PO TBDP: 4.0000 mg | ORAL_TABLET | Freq: Three times a day (TID) | ORAL | 1 refills | Status: DC | PRN

## 2024-03-05 MED ORDER — TRIAMCINOLONE ACETONIDE 0.5 % EX OINT: 1.0000 | TOPICAL_OINTMENT | Freq: Two times a day (BID) | CUTANEOUS | 1 refills | Status: DC

## 2024-03-05 NOTE — Progress Notes (Signed)
 Complete physical exam  Patient: Rachael Sherman   DOB: 1986-12-06   38 y.o. Female  MRN: [id number]  Subjective:    Chief Complaint  Patient presents with   Annual Exam    Patient states she is here for her annual physical, Prior Dr.Richard Marrow patient  Patient states no concerns    [name] is a 38 y.o. female who presents today for a complete physical exam. She reports consuming a general diet. Gym/ health club routine includes cardio. She generally feels well. She reports sleeping well. She does not have additional problems to discuss today.    Most recent fall risk assessment:    03/05/2024    3:05 PM  Fall Risk   Falls in the past year? 0  Number falls in past yr: 0  Injury with Fall? 0  Risk for fall due to : No Fall Risks  Follow up Falls evaluation completed     Most recent depression screenings:    03/05/2024    3:05 PM 04/29/2023   10:09 AM  PHQ 2/9 Scores  PHQ - 2 Score 0 0  PHQ- 9 Score 2 2      Patient Care Team: Tyson Alias, MD as PCP - General (Internal Medicine) Debera Lat, MD as PCP - Cardiology (Family Medicine)   Outpatient Medications Prior to Visit  Medication Sig   [DISCONTINUED] amlodipine-olmesartan (AZOR) 10-20 MG tablet Take 1 tablet by mouth daily.   [DISCONTINUED] baclofen (LIORESAL) 10 MG tablet Take 1 tablet (10 mg total) by mouth 3 (three) times daily.   [DISCONTINUED] fluconazole (DIFLUCAN) 150 MG tablet Take 1 tablet by mouth once for one dose. Repeat in 3 days if needed.   [DISCONTINUED] hydrOXYzine (ATARAX) 25 MG tablet Take 1 tablet (25 mg total) by mouth 3 (three) times daily as needed.   [DISCONTINUED] ibuprofen (ADVIL) 600 MG tablet Take 1 tablet (600 mg total) by mouth every 8 (eight) hours as needed for up to 30 doses for fever, headache, mild pain or moderate pain (Inflammation). Take 1 tablet 3 times daily as needed for inflammation of upper airways and/or pain.   [DISCONTINUED] naproxen (NAPROSYN)  500 MG tablet Take 1 tablet (500 mg total) by mouth 2 (two) times daily with a meal.   [DISCONTINUED] ondansetron (ZOFRAN-ODT) 4 MG disintegrating tablet Take 1 tablet (4 mg total) by mouth every 8 (eight) hours as needed for nausea or vomiting.   [DISCONTINUED] SUMAtriptan (IMITREX) 50 MG tablet Take 1 tablet (50 mg total) by mouth every 2 (two) hours as needed for migraine. May repeat in 2 hours if headache persists or recurs.   [DISCONTINUED] triamcinolone ointment (KENALOG) 0.5 % Apply 1 application topically 2 (two) times daily.   [DISCONTINUED] amitriptyline (ELAVIL) 10 MG tablet TAKE 1 TABLET (10 MG TOTAL) BY MOUTH AT BEDTIME.   No facility-administered medications prior to visit.    ROS  No fevers, chills, chest pain, or dyspnea      Objective:     BP 112/68   Pulse 61   Temp 98.1 F (36.7 C) (Temporal)   Wt 102 lb (46.3 kg)   SpO2 98%   BMI 18.66 kg/m    Physical Exam   Gen: well appearing Eyes: normal Ears: normal TM Neck: no thyromegaly, no adenopathy Heart: regular, no murmurs Lungs: unlabored, clear throughout Abd: soft, non tender, no organomegaly Ext: warm, no edema, normal joints Psych: normal affect, not depressed or anxious appearing Neuro: conversational, full strength throughout  Assessment & Plan:    Routine Health Maintenance and Physical Exam  Immunization History  Administered Date(s) Administered   Influenza-Unspecified 10/10/2018, 10/05/2019   Tdap 03/09/2017    Health Maintenance  Topic Date Due   INFLUENZA VACCINE  07/28/2023   DTaP/Tdap/Td (2 - Td or Tdap) 03/10/2027   Cervical Cancer Screening (HPV/Pap Cotest)  08/04/2027   HIV Screening  Completed   HPV VACCINES  Aged Out   COVID-19 Vaccine  Discontinued   Hepatitis C Screening  Discontinued    Discussed health benefits of physical activity, and encouraged her to engage in regular exercise appropriate for her age and condition.  Problem List Items Addressed This Visit        Medium    Endometriosis determined by laparoscopy (Chronic)   Chronic and stable. Comanaged with gynecology. Not currently using hormonal therapy after many attempts could not find an effective treatment. We are managing supportively. Intermittent nausea seems to be the biggest issue, for which she uses zofran as needed and sparingly. EKG shows normal QT. Not having menorrhagia or symptoms of anemia or iron deficiency.       Relevant Medications   ondansetron (ZOFRAN-ODT) 4 MG disintegrating tablet     Low   Healthcare maintenance (Chronic)   Due for follow up cervical cancer screening, she is going to visit with gynecology office that did her last test. Seems like it may have been an ASCUS result that needed one year follow up. She has elected to start breast cancer screening early due to abnormal lumps in the past, she does these annually. She had a history of high blood pressure, but that is resolved now. No depression or anxiety. Home life seems supportive and safe. No need for labs right now, would do lipids in 2-3 years, and would do BMP of she has hypertension return.       Atopic dermatitis - Primary (Chronic)   Intermittent atopic dermatitis has been an issue on arms in flexural surfaces. No rash right now. Will refill triamcinolone ointment to use as needed.          Return in about 1 year (around 03/05/2025).     Tyson Alias, MD

## 2024-03-05 NOTE — Assessment & Plan Note (Signed)
 Intermittent atopic dermatitis has been an issue on arms in flexural surfaces. No rash right now. Will refill triamcinolone ointment to use as needed.

## 2024-03-05 NOTE — Assessment & Plan Note (Signed)
 Chronic and stable. Comanaged with gynecology. Not currently using hormonal therapy after many attempts could not find an effective treatment. We are managing supportively. Intermittent nausea seems to be the biggest issue, for which she uses zofran as needed and sparingly. EKG shows normal QT. Not having menorrhagia or symptoms of anemia or iron deficiency.

## 2024-03-05 NOTE — Assessment & Plan Note (Signed)
 Due for follow up cervical cancer screening, she is going to visit with gynecology office that did her last test. Seems like it may have been an ASCUS result that needed one year follow up. She has elected to start breast cancer screening early due to abnormal lumps in the past, she does these annually. She had a history of high blood pressure, but that is resolved now. No depression or anxiety. Home life seems supportive and safe. No need for labs right now, would do lipids in 2-3 years, and would do BMP of she has hypertension return.

## 2024-03-09 ENCOUNTER — Telehealth: Admitting: Family Medicine

## 2024-03-09 DIAGNOSIS — N390 Urinary tract infection, site not specified: Secondary | ICD-10-CM

## 2024-03-09 DIAGNOSIS — B3731 Acute candidiasis of vulva and vagina: Secondary | ICD-10-CM

## 2024-03-09 MED ORDER — FLUCONAZOLE 150 MG PO TABS: 150.0000 mg | ORAL_TABLET | Freq: Every day | ORAL | 0 refills | Status: AC

## 2024-03-09 MED ORDER — SULFAMETHOXAZOLE-TRIMETHOPRIM 800-160 MG PO TABS: 1.0000 | ORAL_TABLET | Freq: Two times a day (BID) | ORAL | 0 refills | Status: AC

## 2024-03-09 NOTE — Progress Notes (Signed)
 E-Visit for Urinary Problems  We are sorry that you are not feeling well.  Here is how we plan to help!  Based on what you shared with me it looks like you most likely have a simple urinary tract infection.  A UTI (Urinary Tract Infection) is a bacterial infection of the bladder.  Most cases of urinary tract infections are simple to treat but a key part of your care is to encourage you to drink plenty of fluids and watch your symptoms carefully.  I have prescribed Bactrim DS One tablet twice a day for 5 days.  Your symptoms should gradually improve. Call us if the burning in your urine worsens, you develop worsening fever, back pain or pelvic pain or if your symptoms do not resolve after completing the antibiotic. I have also sent diflucan for yeast.   Urinary tract infections can be prevented by drinking plenty of water to keep your body hydrated.  Also be sure when you wipe, wipe from front to back and don't hold it in!  If possible, empty your bladder every 4 hours.  HOME CARE Drink plenty of fluids Compete the full course of the antibiotics even if the symptoms resolve Remember, when you need to go.go. Holding in your urine can increase the likelihood of getting a UTI! GET HELP RIGHT AWAY IF: You cannot urinate You get a high fever Worsening back pain occurs You see blood in your urine You feel sick to your stomach or throw up You feel like you are going to pass out  MAKE SURE YOU  Understand these instructions. Will watch your condition. Will get help right away if you are not doing well or get worse.   Thank you for choosing an e-visit.  Your e-visit answers were reviewed by a board certified advanced clinical practitioner to complete your personal care plan. Depending upon the condition, your plan could have included both over the counter or prescription medications.  Please review your pharmacy choice. Make sure the pharmacy is open so you can pick up prescription now. If  there is a problem, you may contact your provider through [employer] and have the prescription routed to another pharmacy.  Your safety is important to [place]. If you have drug allergies check your prescription carefully.   For the next 24 hours you can use MyChart to ask questions about today's visit, request a non-urgent call back, or ask for a work or school excuse. You will get an email in the next two days asking about your experience. I hope that your e-visit has been valuable and will speed your recovery.   have provided 5 minutes of non face to face time during this encounter for chart review and documentation.

## 2024-04-17 ENCOUNTER — Other Ambulatory Visit (HOSPITAL_COMMUNITY): Payer: Self-pay

## 2024-04-17 MED ORDER — TRAMADOL HCL 50 MG PO TABS
50.0000 mg | ORAL_TABLET | Freq: Four times a day (QID) | ORAL | 0 refills | Status: AC | PRN
  Filled 2024-04-17: qty 10, 3d supply, fill #0

## 2024-04-17 MED ORDER — PENICILLIN V POTASSIUM 500 MG PO TABS
500.0000 mg | ORAL_TABLET | Freq: Four times a day (QID) | ORAL | 0 refills | Status: DC
  Filled 2024-04-17: qty 24, 6d supply, fill #0

## 2024-05-07 DIAGNOSIS — N939 Abnormal uterine and vaginal bleeding, unspecified: Secondary | ICD-10-CM | POA: Diagnosis not present

## 2024-05-07 DIAGNOSIS — Z3202 Encounter for pregnancy test, result negative: Secondary | ICD-10-CM | POA: Diagnosis not present

## 2024-05-07 DIAGNOSIS — N760 Acute vaginitis: Secondary | ICD-10-CM | POA: Diagnosis not present

## 2024-05-09 ENCOUNTER — Telehealth: Admitting: Student in an Organized Health Care Education/Training Program

## 2024-05-09 DIAGNOSIS — B001 Herpesviral vesicular dermatitis: Secondary | ICD-10-CM | POA: Diagnosis not present

## 2024-05-09 MED ORDER — VALACYCLOVIR HCL 1 G PO TABS: 2000.0000 mg | ORAL_TABLET | Freq: Two times a day (BID) | ORAL | 0 refills | Status: AC

## 2024-05-09 NOTE — Progress Notes (Signed)
 E-Visit for [employer]  We are sorry that you are not feeling well.  Here is how we plan to help!  Based on what you have shared with me it does look like you have a viral infection.    Most cold sores or fever blisters are small fluid filled blisters around the mouth caused by herpes simplex virus.  The most common strain of the virus causing cold sores is herpes simplex virus 1.  It can be spread by skin contact, sharing eating utensils, or even sharing towels.  Cold sores are contagious to other people until dry. (Approximately 5-7 days).  Wash your hands. You can spread the virus to your eyes through handling your contact lenses after touching the lesions.  Most people experience pain at the sight or tingling sensations in their lips that may begin before the ulcers erupt.  Herpes simplex is treatable but not curable.  It may lie dormant for a long time and then reappear due to stress or prolonged sun exposure.  Many patients have success in treating their cold sores with an over the counter topical called Abreva.  You may apply the cream up to 5 times daily (maximum 10 days) until healing occurs.  If you would like to use an oral antiviral medication to speed the healing of your cold sore, I have sent a prescription to your local pharmacy Valacyclovir 2 gm take one by mouth twice a day for 1 day    HOME CARE:  Wash your hands frequently. Do not pick at or rub the sore. Don't open the blisters. Avoid kissing other people during this time. Avoid sharing drinking glasses, eating utensils, or razors. Do not handle contact lenses unless you have thoroughly washed your hands with soap and warm water! Avoid oral sex during this time.  Herpes from sores on your mouth can spread to your partner's genital area. Avoid contact with anyone who has eczema or a weakened immune system. Cold sores are often triggered by exposure to intense sunlight, use a lip balm containing a sunscreen (SPF 30 or  higher).  GET HELP RIGHT AWAY IF:  Blisters look infected. Blisters occur near or in the eye. Symptoms last longer than 10 days. Your symptoms become worse.  MAKE SURE YOU:  Understand these instructions. Will watch your condition. Will get help right away if you are not doing well or get worse.    Your e-visit answers were reviewed by a board certified advanced clinical practitioner to complete your personal care plan.  Depending upon the condition, your plan could have  Included both over the counter or prescription medications.    Please review your pharmacy choice.  Be sure that the pharmacy you have chosen is open so that you can pick up your prescription now.  If there is a problem you can message your provider in MyChart to have the prescription routed to another pharmacy.    Your safety is important to [place].  If you have drug allergies check our prescription carefully.  For the next 24 hours you can use MyChart to ask questions about today's visit, request a non-urgent call back, or ask for a work or school excuse from your e-visit provider.  You will get an email in the next two days asking about your experience.  I hope that your e-visit has been valuable and will speed your recovery.  I provided 5 minutes of non face-to-face time during this encounter for chart review, medication and order placement,  as well as and documentation.

## 2024-06-25 ENCOUNTER — Ambulatory Visit: Admitting: Nurse Practitioner

## 2024-06-28 ENCOUNTER — Telehealth: Admitting: Physician Assistant

## 2024-06-28 DIAGNOSIS — K047 Periapical abscess without sinus: Secondary | ICD-10-CM | POA: Diagnosis not present

## 2024-06-29 MED ORDER — AMOXICILLIN-POT CLAVULANATE 875-125 MG PO TABS: 1.0000 | ORAL_TABLET | Freq: Two times a day (BID) | ORAL | 0 refills | Status: DC

## 2024-06-29 NOTE — Progress Notes (Signed)
 E-Visit for Dental Pain  We are sorry that you are not feeling well.  Here is how we plan to help!  Based on what you have shared with me in the questionnaire, it sounds like you have a dental infection with broken tooth.  Augmentin  875-125mg  twice a day for 7 days  It is imperative that you see a dentist within 10 days of this eVisit to determine the cause of the dental pain and be sure it is adequately treated  A toothache or tooth pain is caused when the nerve in the root of a tooth or surrounding a tooth is irritated. Dental (tooth) infection, decay, injury, or loss of a tooth are the most common causes of dental pain. Pain may also occur after an extraction (tooth is pulled out). Pain sometimes originates from other areas and radiates to the jaw, thus appearing to be tooth pain.Bacteria growing inside your mouth can contribute to gum disease and dental decay, both of which can cause pain. A toothache occurs from inflammation of the central portion of the tooth called pulp. The pulp contains nerve endings that are very sensitive to pain. Inflammation to the pulp or pulpitis may be caused by dental cavities, trauma, and infection.    HOME CARE:   For toothaches: Over-the-counter pain medications such as acetaminophen  or ibuprofen  may be used. Take these as directed on the package while you arrange for a dental appointment. Avoid very cold or hot foods, because they may make the pain worse. You may get relief from biting on a cotton ball soaked in oil of cloves. You can get oil of cloves at most drug stores.  For jaw pain:  Aspirin may be helpful for problems in the joint of the jaw in adults. If pain happens every time you open your mouth widely, the temporomandibular joint (TMJ) may be the source of the pain. Yawning or taking a large bite of food may worsen the pain. An appointment with your doctor or dentist will help you find the cause.     GET HELP RIGHT AWAY IF:  You have a high  fever or chills If you have had a recent head or face injury and develop headache, light headedness, nausea, vomiting, or other symptoms that concern you after an injury to your face or mouth, you could have a more serious injury in addition to your dental injury. A facial rash associated with a toothache: This condition may improve with medication. Contact your doctor for them to decide what is appropriate. Any jaw pain occurring with chest pain: Although jaw pain is most commonly caused by dental disease, it is sometimes referred pain from other areas. People with heart disease, especially people who have had stents placed, people with diabetes, or those who have had heart surgery may have jaw pain as a symptom of heart attack or angina. If your jaw or tooth pain is associated with lightheadedness, sweating, or shortness of breath, you should see a doctor as soon as possible. Trouble swallowing or excessive pain or bleeding from gums: If you have a history of a weakened immune system, diabetes, or steroid use, you may be more susceptible to infections. Infections can often be more severe and extensive or caused by unusual organisms. Dental and gum infections in people with these conditions may require more aggressive treatment. An abscess may need draining or IV antibiotics, for example.  MAKE SURE YOU   Understand these instructions. Will watch your condition. Will get help right away  if you are not doing well or get worse.  Thank you for choosing an e-visit.  Your e-visit answers were reviewed by a board certified advanced clinical practitioner to complete your personal care plan. Depending upon the condition, your plan could have included both over the counter or prescription medications.  Please review your pharmacy choice. Make sure the pharmacy is open so you can pick up prescription now. If there is a problem, you may contact your provider through [employer] and have the prescription  routed to another pharmacy.  Your safety is important to us . If you have drug allergies check your prescription carefully.   For the next 24 hours you can use MyChart to ask questions about today's visit, request a non-urgent call back, or ask for a work or school excuse. You will get an email in the next two days asking about your experience. I hope that your e-visit has been valuable and will speed your recovery.   I have spent 5 minutes in review of e-visit questionnaire, review and updating patient chart, medical decision making and response to patient.   Delon CHRISTELLA Dickinson, PA-C

## 2024-07-06 ENCOUNTER — Ambulatory Visit (INDEPENDENT_AMBULATORY_CARE_PROVIDER_SITE_OTHER): Admitting: Nurse Practitioner

## 2024-07-06 ENCOUNTER — Other Ambulatory Visit (HOSPITAL_COMMUNITY)
Admission: RE | Admit: 2024-07-06 | Discharge: 2024-07-06 | Disposition: A | Discharge status: Home / Self Care | Source: Ambulatory Visit | Attending: Nurse Practitioner | Admitting: Nurse Practitioner

## 2024-07-06 ENCOUNTER — Encounter: Payer: Self-pay | Admitting: Nurse Practitioner

## 2024-07-06 VITALS — BP 116/84 | HR 80 | Ht 63.0 in | Wt 107.0 lb

## 2024-07-06 DIAGNOSIS — N809 Endometriosis, unspecified: Secondary | ICD-10-CM

## 2024-07-06 DIAGNOSIS — Z113 Encounter for screening for infections with a predominantly sexual mode of transmission: Secondary | ICD-10-CM

## 2024-07-06 DIAGNOSIS — Z124 Encounter for screening for malignant neoplasm of cervix: Secondary | ICD-10-CM | POA: Insufficient documentation

## 2024-07-06 DIAGNOSIS — N921 Excessive and frequent menstruation with irregular cycle: Secondary | ICD-10-CM | POA: Diagnosis not present

## 2024-07-06 DIAGNOSIS — Z1331 Encounter for screening for depression: Secondary | ICD-10-CM | POA: Diagnosis not present

## 2024-07-06 DIAGNOSIS — L309 Dermatitis, unspecified: Secondary | ICD-10-CM

## 2024-07-06 DIAGNOSIS — R110 Nausea: Secondary | ICD-10-CM | POA: Diagnosis not present

## 2024-07-06 DIAGNOSIS — Z01419 Encounter for gynecological examination (general) (routine) without abnormal findings: Secondary | ICD-10-CM | POA: Diagnosis not present

## 2024-07-06 MED ORDER — ONDANSETRON 4 MG PO TBDP
4.0000 mg | ORAL_TABLET | Freq: Three times a day (TID) | ORAL | 1 refills | Status: AC | PRN
Start: 2024-07-06 — End: ?

## 2024-07-06 MED ORDER — TRIAMCINOLONE ACETONIDE 0.5 % EX OINT: 1.0000 | TOPICAL_OINTMENT | Freq: Two times a day (BID) | CUTANEOUS | 1 refills | Status: AC

## 2024-07-06 NOTE — Progress Notes (Signed)
 Rachael Sherman 12-Apr-1986 [id number]   History:  38 y.o. G2P2002 presents for annual exam. Monthly cycles. Has been spotting before and after period the last 2 months. Menses have been tolerable in flow but pain can vary. Takes Zofran  as needed for nausea s/t menses. Denies vaginal symptoms. New partner. Endometriosis diagnosed laparoscopically in 2014. Has been on Depo, patch, OCPs, Lupron, Orilissa . Did not tolerate Lupron or Orilissa . 2014 abnormal pap, 2020 normal cytology + HR HPV, 2023 ASCUS neg HPV. H/O migraines and HTN.   Gynecologic History Patient's last menstrual period was 06/29/2024 (exact date). Period Cycle (Days): 28 Period Duration (Days): 4 Period Pattern: Regular (Has been having a lot of spotting before and after her period) Menstrual Flow: Moderate, Light Menstrual Control: Maxi pad Dysmenorrhea: (!) Severe Dysmenorrhea Symptoms: Cramping, Nausea, Headache (Back pain) Contraception/Family planning: tubal ligation Sexually active: Yes  Health Maintenance Last Pap: 08/03/2022. Results were: ASCUS neg HPV Last mammogram: 04/05/2019 (right ultrasound). Results were: Normal Last colonoscopy: Not indicated Last Dexa: Not indicated  Past medical history, past surgical history, family history and social history were all reviewed and documented in the EPIC chart. Has 51 and 65 yo daughters. Works at [employer].   ROS:  A ROS was performed and pertinent positives and negatives are included.  Exam:  Vitals:   07/06/24 1550  BP: 116/84  Pulse: 80  SpO2: 100%  Weight: 107 lb (48.5 kg)  Height: 5' 3 (1.6 m)    Body mass index is 18.95 kg/m.  General appearance:  Normal Thyroid :  Symmetrical, normal in size, without palpable masses or nodularity. Respiratory  Auscultation:  Clear without wheezing or rhonchi Cardiovascular  Auscultation:  Regular rate, without rubs, murmurs or gallops  Edema/varicosities:  Not grossly evident Abdominal  Soft,nontender,  without masses, guarding or rebound.  Liver/spleen:  No organomegaly noted  Hernia:  None appreciated  Skin  Inspection:  Grossly normal Breasts: Examined lying and sitting.   Right: Without masses, retractions, nipple discharge or axillary adenopathy.  Left: Without masses, retractions, nipple discharge or axillary adenopathy. Pelvic: External genitalia:  no lesions              Urethra:  normal appearing urethra with no masses, tenderness or lesions              Bartholins and Skenes: normal                 Vagina: normal appearing vagina with normal color and discharge, no lesions. + odor.               Cervix: no lesions Bimanual Exam:  Uterus:  no masses or tenderness              Adnexa: no mass, fullness, tenderness              Rectovaginal: Deferred              Anus:  normal, no lesions  Patient informed chaperone available to be present for breast and pelvic exam. Patient has requested no chaperone to be present. Patient has been advised what will be completed during breast and pelvic exam.   Assessment/Plan:  38 y.o. G2P2002 for annual exam.    Well female exam with routine gynecological exam - Education provided on SBEs, importance of preventative screenings, current guidelines, high calcium diet, regular exercise, and multivitamin daily.  Labs with PCP.   Endometriosis determined by laparoscopy - 2014 laparoscopy. Has tried Depo, patch, COCs, Orilissa  and  Lupron. Did not tolerate Lupron and Orilissa . Will avoid estrogen due to HTN. Menses have been tolerable. Dysmenorrhea varies.   Cervical cancer screening - Plan: Cytology - PAP( [facility]). Abnormal pap in 2014, no intervention per patient. 2020 pap normal + HR HPV, 2023 ASCUS neg HPV. Pap today per guidelines.   Screening examination for STD (sexually transmitted disease) - Plan: SureSwab Advanced Vaginitis Plus,TMA.   Breakthrough bleeding - Plan: SureSwab Advanced Vaginitis Plus,TMA. + vaginal odor.   Dermatitis  of nipple - Plan: triamcinolone  ointment (KENALOG ) 0.5 % BID as needed.   Nausea - Plan: ondansetron  (ZOFRAN -ODT) 4 MG disintegrating tablet every 8 hours as needed for nausea s/t menses/dysmenorrhea.   Return in about 1 year (around 07/06/2025) for Annual.     Annabella DELENA Shutter DNP, 4:16 PM 07/06/2024

## 2024-07-09 DIAGNOSIS — Z113 Encounter for screening for infections with a predominantly sexual mode of transmission: Secondary | ICD-10-CM | POA: Diagnosis not present

## 2024-07-09 DIAGNOSIS — N921 Excessive and frequent menstruation with irregular cycle: Secondary | ICD-10-CM | POA: Diagnosis not present

## 2024-07-10 LAB — SURESWAB® ADVANCED VAGINITIS PLUS,TMA
C. trachomatis RNA, TMA: NOT DETECTED
CANDIDA SPECIES: NOT DETECTED
Candida glabrata: NOT DETECTED
N. gonorrhoeae RNA, TMA: NOT DETECTED
SURESWAB(R) ADV BACTERIAL VAGINOSIS(BV),TMA: POSITIVE — AB
TRICHOMONAS VAGINALIS (TV),TMA: NOT DETECTED

## 2024-07-11 ENCOUNTER — Other Ambulatory Visit: Payer: Self-pay | Admitting: Nurse Practitioner

## 2024-07-11 ENCOUNTER — Ambulatory Visit: Payer: Self-pay | Admitting: Nurse Practitioner

## 2024-07-11 DIAGNOSIS — N760 Acute vaginitis: Secondary | ICD-10-CM

## 2024-07-11 MED ORDER — METRONIDAZOLE 0.75 % VA GEL
1.0000 | Freq: Every day | VAGINAL | 0 refills | Status: AC
Start: 2024-07-11 — End: 2024-07-16

## 2024-07-11 MED ORDER — METRONIDAZOLE 500 MG PO TABS: 500.0000 mg | ORAL_TABLET | Freq: Two times a day (BID) | ORAL | 0 refills | Status: DC

## 2024-07-11 NOTE — Telephone Encounter (Signed)
 Metrogel sent.

## 2024-07-11 NOTE — Telephone Encounter (Signed)
 Tiffany -ok to send Metrogel ?

## 2024-07-16 LAB — CYTOLOGY - PAP
Comment: NEGATIVE
Diagnosis: NEGATIVE
Diagnosis: REACTIVE
High risk HPV: NEGATIVE

## 2024-07-22 ENCOUNTER — Other Ambulatory Visit: Payer: Self-pay | Admitting: Medical Genetics

## 2024-08-03 ENCOUNTER — Other Ambulatory Visit (HOSPITAL_COMMUNITY)

## 2024-08-15 ENCOUNTER — Other Ambulatory Visit (HOSPITAL_COMMUNITY)

## 2024-09-18 ENCOUNTER — Telehealth: Admitting: Physician Assistant

## 2024-09-18 DIAGNOSIS — R3989 Other symptoms and signs involving the genitourinary system: Secondary | ICD-10-CM | POA: Diagnosis not present

## 2024-09-18 MED ORDER — CEPHALEXIN 500 MG PO CAPS: 500.0000 mg | ORAL_CAPSULE | Freq: Two times a day (BID) | ORAL | 0 refills | Status: AC

## 2024-09-18 NOTE — Progress Notes (Signed)

## 2024-09-18 NOTE — Progress Notes (Signed)
 I have spent 5 minutes in review of e-visit questionnaire, review and updating patient chart, medical decision making and response to patient.   Elsie Velma Lunger, PA-C

## 2024-10-04 ENCOUNTER — Other Ambulatory Visit: Payer: Self-pay

## 2024-10-04 NOTE — Telephone Encounter (Signed)
 Med refill request: metronidazole  vaginal gel  Last AEX: 07/06/24 Next AEX: not scheduled  Last MMG (if hormonal med) Refill authorized: last rx 07/11/24 50g with 0 refills. Please approve or deny

## 2024-10-05 ENCOUNTER — Telehealth: Payer: Self-pay

## 2024-10-05 NOTE — Telephone Encounter (Addendum)
 Please disregard. Another CMA already requested refill.

## 2024-10-10 ENCOUNTER — Telehealth: Payer: Self-pay

## 2024-10-10 NOTE — Telephone Encounter (Signed)
 Received a refill request from [facility] for the Metronidazole  0.75% vaginal gel. NP prescribed this back in July of 2025, but then Pt was switched to the Flagyl  tablets in August. Pt was called to ask if she requested the refill herself, and which Rx she wanted.  No answer. Left vm for Pt to call us  back to clarify. Waiting for a call back.

## 2024-10-12 ENCOUNTER — Other Ambulatory Visit: Payer: Self-pay | Admitting: Medical Genetics

## 2024-10-12 DIAGNOSIS — Z006 Encounter for examination for normal comparison and control in clinical research program: Secondary | ICD-10-CM

## 2024-11-04 ENCOUNTER — Telehealth: Admitting: Physician Assistant

## 2024-11-04 DIAGNOSIS — R3989 Other symptoms and signs involving the genitourinary system: Secondary | ICD-10-CM | POA: Diagnosis not present

## 2024-11-04 MED ORDER — CEPHALEXIN 500 MG PO CAPS: 500.0000 mg | ORAL_CAPSULE | Freq: Two times a day (BID) | ORAL | 0 refills | Status: AC

## 2024-11-04 NOTE — Progress Notes (Signed)

## 2024-11-13 LAB — GENECONNECT MOLECULAR SCREEN: Genetic Analysis Overall Interpretation: NEGATIVE

## 2024-12-15 ENCOUNTER — Telehealth: Admitting: Physician Assistant

## 2024-12-15 DIAGNOSIS — H1032 Unspecified acute conjunctivitis, left eye: Secondary | ICD-10-CM | POA: Diagnosis not present

## 2024-12-16 MED ORDER — POLYMYXIN B-TRIMETHOPRIM 10000-0.1 UNIT/ML-% OP SOLN: OPHTHALMIC | 0 refills | Status: AC

## 2024-12-16 NOTE — Progress Notes (Signed)
 We are sorry that you are not feeling well.  Here is how we plan to help!  Based on what you have shared with me it looks like you have conjunctivitis.  Conjunctivitis is a common inflammatory or infectious condition of the eye that is often referred to as pink eye.  In most cases it is contagious (viral or bacterial). However, not all conjunctivitis requires antibiotics (ex. Allergic).  We have made appropriate suggestions for you based upon your presentation.  I have prescribed Polytrim  Ophthalmic drops 1-2 drops 4 times a day times 5 days  Pink eye can be highly contagious.  It is typically spread through direct contact with secretions, or contaminated objects or surfaces that one may have touched.  Strict handwashing is suggested with soap and water is urged.  If not available, use alcohol based had sanitizer.  Avoid unnecessary touching of the eye.  If you wear contact lenses, you will need to refrain from wearing them until you see no white discharge from the eye for at least 24 hours after being on medication.  You should see symptom improvement in 1-2 days after starting the medication regimen.  Call us  if symptoms are not improved in 1-2 days.  Home Care: Wash your hands often! Do not wear your contacts until you complete your treatment plan. Avoid sharing towels, bed linen, personal items with a person who has pink eye. See attention for anyone in your home with similar symptoms.  Get Help Right Away If: Your symptoms do not improve. You develop blurred or loss of vision. Your symptoms worsen (increased discharge, pain or redness)  Your e-visit answers were reviewed by a board certified advanced clinical practitioner to complete your personal care plan.  Depending on the condition, your plan could have included both over the counter or prescription medications.  If there is a problem please reply  once you have received a response from your provider.  Your safety is important to  us .  If you have drug allergies check your prescription carefully.    You can use MyChart to ask questions about today's visit, request a non-urgent call back, or ask for a work or school excuse for 24 hours related to this e-Visit. If it has been greater than 24 hours you will need to follow up with your provider, or enter a new e-Visit to address those concerns.   You will get an e-mail in the next two days asking about your experience.  I hope that your e-visit has been valuable and will speed your recovery. Thank you for using e-visits.  I have spent 5 minutes in review of e-visit questionnaire, review and updating patient chart, medical decision making and response to patient.   Elsie Velma Lunger, PA-C
